# Patient Record
Sex: Male | Born: 1950 | Race: White | Hispanic: No | Smoking: Former smoker
Health system: Southern US, Community
[De-identification: ages and names within clinical notes are randomized; demographics above are authoritative.]

## PROBLEM LIST (undated history)

## (undated) DIAGNOSIS — G4730 Sleep apnea, unspecified: Secondary | ICD-10-CM

## (undated) DIAGNOSIS — E7800 Pure hypercholesterolemia, unspecified: Secondary | ICD-10-CM

## (undated) DIAGNOSIS — N200 Calculus of kidney: Secondary | ICD-10-CM

## (undated) DIAGNOSIS — C801 Malignant (primary) neoplasm, unspecified: Secondary | ICD-10-CM

## (undated) DIAGNOSIS — Z87442 Personal history of urinary calculi: Secondary | ICD-10-CM

## (undated) DIAGNOSIS — K4090 Unilateral inguinal hernia, without obstruction or gangrene, not specified as recurrent: Secondary | ICD-10-CM

## (undated) HISTORY — PX: INGUINAL HERNIA REPAIR: SUR1180

## (undated) HISTORY — PX: TOE SURGERY: SHX1073

---
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
## 2003-10-29 ENCOUNTER — Ambulatory Visit (HOSPITAL_COMMUNITY): Admission: RE | Admit: 2003-10-29 | Discharge: 2003-10-29 | Payer: Self-pay | Admitting: General Surgery

## 2012-08-09 ENCOUNTER — Emergency Department (HOSPITAL_COMMUNITY): Payer: 59

## 2012-08-09 ENCOUNTER — Emergency Department (HOSPITAL_COMMUNITY)
Admission: EM | Admit: 2012-08-09 | Discharge: 2012-08-09 | Disposition: A | Discharge status: Home / Self Care | Payer: 59 | Attending: Emergency Medicine | Admitting: Emergency Medicine

## 2012-08-09 ENCOUNTER — Encounter (HOSPITAL_COMMUNITY): Payer: Self-pay | Admitting: Emergency Medicine

## 2012-08-09 DIAGNOSIS — Z87442 Personal history of urinary calculi: Secondary | ICD-10-CM | POA: Insufficient documentation

## 2012-08-09 DIAGNOSIS — M79604 Pain in right leg: Secondary | ICD-10-CM

## 2012-08-09 DIAGNOSIS — E7800 Pure hypercholesterolemia, unspecified: Secondary | ICD-10-CM | POA: Insufficient documentation

## 2012-08-09 DIAGNOSIS — R52 Pain, unspecified: Secondary | ICD-10-CM

## 2012-08-09 DIAGNOSIS — M7989 Other specified soft tissue disorders: Secondary | ICD-10-CM | POA: Insufficient documentation

## 2012-08-09 DIAGNOSIS — M79609 Pain in unspecified limb: Secondary | ICD-10-CM | POA: Insufficient documentation

## 2012-08-09 DIAGNOSIS — Z8719 Personal history of other diseases of the digestive system: Secondary | ICD-10-CM | POA: Insufficient documentation

## 2012-08-09 DIAGNOSIS — Z87891 Personal history of nicotine dependence: Secondary | ICD-10-CM | POA: Insufficient documentation

## 2012-08-09 HISTORY — DX: Pure hypercholesterolemia, unspecified: E78.00

## 2012-08-09 HISTORY — DX: Calculus of kidney: N20.0

## 2012-08-09 HISTORY — DX: Unilateral inguinal hernia, without obstruction or gangrene, not specified as recurrent: K40.90

## 2012-08-09 MED ORDER — ENOXAPARIN SODIUM 120 MG/0.8ML ~~LOC~~ SOLN
1.0000 mg/kg | Freq: Once | SUBCUTANEOUS | Status: AC
  Administered 2012-08-09: 105 mg via SUBCUTANEOUS
  Filled 2012-08-09: qty 0.8

## 2012-08-09 NOTE — ED Notes (Signed)
Patient c/o right leg pain and swelling. Patient has positive homans side with pain in calf. Patient has multiple veracious veins, denies hx of DVT. Pedal pulse present, foot warm.

## 2012-08-09 NOTE — ED Provider Notes (Addendum)
History    This chart was scribed for joe Huriel Matt  MD by Gerlean Ren, ED Scribe. This patient was seen in room APA16A/APA16A and the patient's care was started at 6:40 PM    CSN: [id number]  Arrival date & time 08/09/12  3086   First MD Initiated Contact with Patient 08/09/12 1840      Chief Complaint  Patient presents with  . Leg Swelling  . Leg Pain     The history is provided by the patient. No language interpreter was used.  Jonathan Chase is a 62 y.o. male who presents to the Emergency Department complaining of right lower leg swelling with associated pain first noticed 10 days ago that has been constant since and neither worsening nor improving.  Pain temporarily worsens when pt stands up but will gradually reduce to baseline pain after ambulating for short period of time.  Pt has used ibuprofen with no improvements to pain.  Pt reports a long car trip 2 days after first noticing pain and swelling and denies that they have worsened since this trip.  Pt denies any recent surgeries.  No h/o blood clots.    Past Medical History  Diagnosis Date  . High cholesterol   . Kidney stones   . Inguinal hernia     Past Surgical History  Procedure Laterality Date  . Inguinal hernia repair Right   . Femur surgery  1967  . Toe surgery Right     second toe    Family History  Problem Relation Age of Onset  . Aortic aneurysm Father     History  Substance Use Topics  . Smoking status: Former Smoker    Types: Cigars  . Smokeless tobacco: Never Used  . Alcohol Use: No      Review of Systems  Constitutional: Negative for appetite change and fatigue.  HENT: Negative for congestion, sinus pressure and ear discharge.   Eyes: Negative for discharge.  Respiratory: Negative for cough.   Cardiovascular: Positive for leg swelling (with associated pain). Negative for chest pain.  Gastrointestinal: Negative for abdominal pain and diarrhea.  Genitourinary: Negative for frequency and  hematuria.  Musculoskeletal: Negative for back pain.  Skin: Negative for rash.  Neurological: Negative for seizures and headaches.  Psychiatric/Behavioral: Negative for hallucinations.    Allergies  Lortab and Robaxin  Home Medications  No current outpatient prescriptions on file.  BP 122/84  Pulse 74  Temp(Src) 97.8 F (36.6 C) (Oral)  Resp 17  Ht 5\' 9"  (1.753 m)  Wt 231 lb (104.781 kg)  BMI 34.1 kg/m2  SpO2 95%  Physical Exam  Nursing note and vitals reviewed. Constitutional: He is oriented to person, place, and time. He appears well-developed.  HENT:  Head: Normocephalic.  Eyes: Conjunctivae and EOM are normal. No scleral icterus.  Neck: Neck supple. No thyromegaly present.  Cardiovascular: Normal rate and regular rhythm.  Exam reveals no gallop and no friction rub.   No murmur heard. Pulmonary/Chest: No stridor. He has no wheezes. He has no rales. He exhibits no tenderness.  Abdominal: He exhibits no distension. There is no tenderness. There is no rebound.  Musculoskeletal: Normal range of motion. He exhibits no edema.  Lymphadenopathy:    He has no cervical adenopathy.  Neurological: He is oriented to person, place, and time. Coordination normal.  Skin: No rash noted. No erythema.  Psychiatric: He has a normal mood and affect. His behavior is normal.    ED Course  Procedures (including critical  care time) DIAGNOSTIC STUDIES: Oxygen Saturation is 95% on room air, adequate by my interpretation.    COORDINATION OF CARE: 6:44 PM- Patient informed of clinical course, understands medical decision-making process, and agrees with plan.  Labs Reviewed - No data to display No results found.   No diagnosis found.    MDM  Pt to get lovenox and follow up for [place] in am The chart was scribed for me under my direct supervision.  I personally performed the history, physical, and medical decision making and all procedures in the evaluation of this  patient.Benny Lennert, MD 08/09/12 1945  Benny Lennert, MD 08/09/12 2026

## 2012-08-10 ENCOUNTER — Ambulatory Visit (HOSPITAL_COMMUNITY)
Admit: 2012-08-10 | Discharge: 2012-08-10 | Disposition: A | Discharge status: Home / Self Care | Payer: 59 | Source: Ambulatory Visit | Attending: Emergency Medicine | Admitting: Emergency Medicine

## 2012-08-10 ENCOUNTER — Telehealth: Payer: Self-pay | Admitting: Orthopedic Surgery

## 2012-08-10 DIAGNOSIS — M79609 Pain in unspecified limb: Secondary | ICD-10-CM | POA: Insufficient documentation

## 2012-08-10 DIAGNOSIS — M7989 Other specified soft tissue disorders: Secondary | ICD-10-CM | POA: Insufficient documentation

## 2012-08-10 DIAGNOSIS — R52 Pain, unspecified: Secondary | ICD-10-CM

## 2012-08-10 NOTE — ED Provider Notes (Signed)
Patient returns as scheduled for venous evaluation. Procedure has been performed and there is no evidence of DVT. Patient was counseled that he needs to followup with his primary doctor. Examination reveals no significant swelling, he does not have any overlying skin changes.  Gilda Crease, MD 08/10/12 [phone]

## 2012-08-10 NOTE — Telephone Encounter (Signed)
Patient came to office following Jonathan Chase Emergency room visit today, 08/10/12, states had Xray, and CT for right leg pain, swelling.  He relays that he had surgery in 1996 and has a "metal plate" in same leg, and had no other treatment after surgery and post op care, as said had been fine for a good while.  He states he had been seen by an orthopedic surgeon, [employer], in 2005, and states his leg had been placed in a cast for 6 weeks.  He will request previous medical records and films/reports.   Please advise if need to review records prior to scheduling the emergency room follow up appointment.  Patient's cell # [phone], home # [phone]

## 2012-08-11 NOTE — Telephone Encounter (Signed)
Called back to patient - advised per Dr. Mort Sawyers note.  He will bring the medical records for review and further advice per Dr. Romeo Apple.

## 2012-08-11 NOTE — Telephone Encounter (Signed)
Yes  And please do not scehedule until i have reviewed everthing

## 2012-08-24 ENCOUNTER — Ambulatory Visit (INDEPENDENT_AMBULATORY_CARE_PROVIDER_SITE_OTHER): Payer: 59 | Admitting: Orthopedic Surgery

## 2012-08-24 ENCOUNTER — Encounter: Payer: Self-pay | Admitting: Orthopedic Surgery

## 2012-08-24 VITALS — Ht 69.0 in | Wt 230.0 lb

## 2012-08-24 DIAGNOSIS — M79669 Pain in unspecified lower leg: Secondary | ICD-10-CM | POA: Insufficient documentation

## 2012-08-24 DIAGNOSIS — M79609 Pain in unspecified limb: Secondary | ICD-10-CM

## 2012-08-24 DIAGNOSIS — M79661 Pain in right lower leg: Secondary | ICD-10-CM

## 2012-08-24 NOTE — Patient Instructions (Signed)
Activities as tolerated  Return as needed

## 2012-08-24 NOTE — Progress Notes (Signed)
Patient ID: Jonathan Chase, male   DOB: Mar 13, 1951, 62 y.o.   MRN: [id number] Chief Complaint  Patient presents with  . Knee Pain    Right knee pain. No injury. Xray at [facility].    Patient history this patient has an unusual history of right calf pain for which she went to the emergency room on May 13 I reviewed the notes. Basically he was preparing for a trip on Monday woke up the next day with severe calf pain difficulty ambulation went to the emergency room and had an ultrasound eventually which showed no evidence of DVT  Since that time his pain has gotten much better. He does have a little discomfort a high and his knee and behind his lower thigh and at the proximal portion of his calf  He denies any numbness or tingling he did have some swelling it worsened after he walks for long periods of time but he says is definitely getting better.  He would also like to establish contact with an orthopedist in [place]  He is allergic to Robaxin and Lortab he has severe reaction to these drugs when taken at the same time  He has a history of a fractured femur with an old K. nail back in October 1967. He had an inguinal hernia repair diameter repair  His other history is in her medical record he is followed by [facility]  5 recorded his meds his social history is that he is married doesn't smoke or drink he doesn't use any street drugs  Review of systems he reports some snoring flank pain joint swelling and joint pain related to his calf injury muscle pain  He has some seasonal allergies to grass  He lost 43 pounds when he was told he might have early diabetes  Ht 5\' 9"  (1.753 m)  Wt 230 lb (104.327 kg)  BMI 33.95 kg/m2 His exam is essentially normal he has normal development nutrition body habitus is a mesomorphic has no deformities he is well-groomed has normal pulses in both feet normal temperature no edema no swelling no major varicose veins. He  ambulates normally. Both calves are examined with both lower extremities the only finding is that his right calf is 45 cm circumference left 43 has some tenderness in both calves normal range of motion in his knees both knees are stable muscle strength and muscle tone are normal skin is intact normal sensation is oriented x3 his mood and affect are normal  His ultrasound and tib-fib x-ray show no abnormality  Impression Strain  Recommend activities as tolerated follow up as needed

## 2012-09-07 ENCOUNTER — Ambulatory Visit: Payer: 59 | Admitting: Orthopedic Surgery

## 2013-02-02 ENCOUNTER — Other Ambulatory Visit: Payer: Self-pay

## 2014-11-06 ENCOUNTER — Encounter (HOSPITAL_COMMUNITY): Payer: Self-pay | Admitting: *Deleted

## 2014-11-06 ENCOUNTER — Emergency Department (HOSPITAL_COMMUNITY)
Admission: EM | Admit: 2014-11-06 | Discharge: 2014-11-06 | Disposition: A | Discharge status: Home / Self Care | Payer: Medicare Other | Attending: Emergency Medicine | Admitting: Emergency Medicine

## 2014-11-06 DIAGNOSIS — Y998 Other external cause status: Secondary | ICD-10-CM | POA: Insufficient documentation

## 2014-11-06 DIAGNOSIS — Z79899 Other long term (current) drug therapy: Secondary | ICD-10-CM | POA: Insufficient documentation

## 2014-11-06 DIAGNOSIS — Y9289 Other specified places as the place of occurrence of the external cause: Secondary | ICD-10-CM | POA: Diagnosis not present

## 2014-11-06 DIAGNOSIS — S81811A Laceration without foreign body, right lower leg, initial encounter: Secondary | ICD-10-CM | POA: Insufficient documentation

## 2014-11-06 DIAGNOSIS — E780 Pure hypercholesterolemia: Secondary | ICD-10-CM | POA: Diagnosis not present

## 2014-11-06 DIAGNOSIS — Z23 Encounter for immunization: Secondary | ICD-10-CM | POA: Insufficient documentation

## 2014-11-06 DIAGNOSIS — Z7952 Long term (current) use of systemic steroids: Secondary | ICD-10-CM | POA: Insufficient documentation

## 2014-11-06 DIAGNOSIS — Y9389 Activity, other specified: Secondary | ICD-10-CM | POA: Insufficient documentation

## 2014-11-06 DIAGNOSIS — Z87891 Personal history of nicotine dependence: Secondary | ICD-10-CM | POA: Insufficient documentation

## 2014-11-06 DIAGNOSIS — W228XXA Striking against or struck by other objects, initial encounter: Secondary | ICD-10-CM | POA: Diagnosis not present

## 2014-11-06 DIAGNOSIS — Z87442 Personal history of urinary calculi: Secondary | ICD-10-CM | POA: Diagnosis not present

## 2014-11-06 DIAGNOSIS — Z8719 Personal history of other diseases of the digestive system: Secondary | ICD-10-CM | POA: Insufficient documentation

## 2014-11-06 DIAGNOSIS — S8991XA Unspecified injury of right lower leg, initial encounter: Secondary | ICD-10-CM | POA: Diagnosis present

## 2014-11-06 MED ORDER — TETANUS-DIPHTH-ACELL PERTUSSIS 5-2.5-18.5 LF-MCG/0.5 IM SUSP
INTRAMUSCULAR | Status: AC
Start: 2014-11-06 — End: 2014-11-06
  Administered 2014-11-06: 0.5 mL via INTRAMUSCULAR
  Filled 2014-11-06: qty 0.5

## 2014-11-06 MED ORDER — TETANUS-DIPHTH-ACELL PERTUSSIS 5-2.5-18.5 LF-MCG/0.5 IM SUSP: 0.5000 mL | Freq: Once | INTRAMUSCULAR | Status: AC

## 2014-11-06 NOTE — ED Provider Notes (Signed)
CSN: [id number]     Arrival date & time 11/06/14  1622 History   First MD Initiated Contact with Patient 11/06/14 1624     Chief Complaint  Patient presents with  . Leg Injury     (Consider location/radiation/quality/duration/timing/severity/associated sxs/prior Treatment) HPI Comments: Pt comes in with c/o laceration that won't stop bleeding. He states that he was weed eating and rock hit him in his right lower leg. He states that he has put multiple dressings to the area and has not been able to stop them from bleeding. Doesn't think there are any retained products. Unsure of last tetanus  The history is provided by the patient. No language interpreter was used.    Past Medical History  Diagnosis Date  . High cholesterol   . Inguinal hernia   . Kidney stones    Past Surgical History  Procedure Laterality Date  . Inguinal hernia repair Right   . Femur surgery  1967  . Toe surgery Right     second toe   Family History  Problem Relation Age of Onset  . Aortic aneurysm Father    History  Substance Use Topics  . Smoking status: Former Smoker    Types: Cigars  . Smokeless tobacco: Never Used  . Alcohol Use: No    Review of Systems  All other systems reviewed and are negative.     Allergies  Lortab and Robaxin  Home Medications   Prior to Admission medications   Medication Sig Start Date End Date Taking? Authorizing Provider  aspirin EC 81 MG tablet Take 81 mg by mouth every evening.    Historical Provider, MD  atorvastatin (LIPITOR) 20 MG tablet Take 20 mg by mouth every evening.    Historical Provider, MD  ibuprofen (ADVIL,MOTRIN) 200 MG tablet Take 200 mg by mouth every 6 (six) hours as needed for pain.    Historical Provider, MD  Multiple Vitamins-Minerals (CENTRUM SILVER ADULT 50+ PO) Take 1 tablet by mouth every morning.    Historical Provider, MD  Omega-3 Fatty Acids (FISH OIL TRIPLE STRENGTH) 1400 MG CAPS Take 1 capsule by mouth every morning.    Historical  Provider, MD  psyllium (HYDROCIL/METAMUCIL) 95 % PACK Take 1 packet by mouth daily.    Historical Provider, MD   BP 138/97 mmHg  Pulse 61  Temp(Src) 98.4 F (36.9 C) (Oral)  Resp 18  Ht 5\' 9"  (1.753 m)  Wt 215 lb (97.523 kg)  BMI 31.74 kg/m2  SpO2 98% Physical Exam  Constitutional: He is oriented to person, place, and time. He appears well-developed and well-nourished.  Cardiovascular: Normal rate and regular rhythm.   Pulmonary/Chest: Effort normal and breath sounds normal.  Musculoskeletal: Normal range of motion.  Neurological: He is alert and oriented to person, place, and time.  Skin:  .5cm laceration noted the right lower leg. Pulses intact  Nursing note and vitals reviewed.   ED Course  LACERATION REPAIR Date/Time: 11/06/2014 4:48 PM Performed by: Glendell Docker Authorized by: Glendell Docker Consent: Verbal consent obtained. Consent given by: patient Patient identity confirmed: verbally with patient Body area: lower extremity Location details: right lower leg Preparation: Patient was prepped and draped in the usual sterile fashion. Irrigation solution: saline Amount of cleaning: standard Debridement: none Degree of undermining: none Skin closure: 4-0 Prolene Number of sutures: 2 Technique: simple Approximation: close Approximation difficulty: simple Patient tolerance: Patient tolerated the procedure well with no immediate complications   (including critical care time) Labs Review Labs Reviewed -  No data to display  Imaging Review No results found.   EKG Interpretation None      MDM   Final diagnoses:  Leg laceration, right, initial encounter    Tetanus updated. Pt given wound care instructions. To have the sutures removed in 7-10 days    Glendell Docker, NP 11/06/14 [place], MD 11/06/14 1911

## 2014-11-06 NOTE — Discharge Instructions (Signed)

## 2014-11-06 NOTE — ED Notes (Signed)
Patient reports using a weed eater and rock hit his right leg, patient reports it is a small laceration but will not stop bleeding. Leg is wrapped with coban in triage.

## 2021-08-07 ENCOUNTER — Encounter: Payer: Self-pay | Admitting: Urology

## 2021-08-07 ENCOUNTER — Ambulatory Visit (INDEPENDENT_AMBULATORY_CARE_PROVIDER_SITE_OTHER): Payer: Medicare Other | Admitting: Urology

## 2021-08-07 VITALS — BP 153/81 | HR 64 | Ht 69.0 in | Wt 250.0 lb

## 2021-08-07 DIAGNOSIS — R3915 Urgency of urination: Secondary | ICD-10-CM

## 2021-08-07 DIAGNOSIS — R972 Elevated prostate specific antigen [PSA]: Secondary | ICD-10-CM

## 2021-08-07 DIAGNOSIS — R310 Gross hematuria: Secondary | ICD-10-CM | POA: Diagnosis not present

## 2021-08-07 DIAGNOSIS — Z87442 Personal history of urinary calculi: Secondary | ICD-10-CM

## 2021-08-07 DIAGNOSIS — N403 Nodular prostate with lower urinary tract symptoms: Secondary | ICD-10-CM | POA: Diagnosis not present

## 2021-08-07 LAB — MICROSCOPIC EXAMINATION
Bacteria, UA: NONE SEEN
Epithelial Cells (non renal): NONE SEEN /hpf (ref 0–10)
Renal Epithel, UA: NONE SEEN /hpf
WBC, UA: NONE SEEN /hpf (ref 0–5)

## 2021-08-07 LAB — URINALYSIS, ROUTINE W REFLEX MICROSCOPIC
Bilirubin, UA: NEGATIVE
Glucose, UA: NEGATIVE
Ketones, UA: NEGATIVE
Leukocytes,UA: NEGATIVE
Nitrite, UA: NEGATIVE
Specific Gravity, UA: 1.025 (ref 1.005–1.030)
Urobilinogen, Ur: 0.2 mg/dL (ref 0.2–1.0)
pH, UA: 7 (ref 5.0–7.5)

## 2021-08-07 NOTE — Progress Notes (Signed)
?Subjective: ?1. Gross hematuria   ?2. History of nephrolithiasis   ?3. Elevated PSA   ?4. 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 Urgency of urination   ?  ? ?Consult requested by Dr. Earney Mallet.  ? ? ?Jonathan Chase is a 71 yo male who had hematuria with right flank pain on 01/22/21 but didn't pass the stone.   He had another episode on 07/24/21 of right flank pain and hematuria and again didn't pass a stone.  He has had some mild left flank pain since then.  He has persistent mild nausea.  He has some urgency but minimal frequency and no nocturia.  His IPSS is 4.  He passed stones in 7/96 and again in 2004-5.  He has had prior prostatitis but none in a long time.  His PSA was 5.4 on 05/26/21 and it was 3.8 in 2022 and 2.2 in 2021.    ?ROS: ? ?Review of Systems  ?HENT:  Positive for congestion.   ?Musculoskeletal:  Positive for back pain and joint pain.  ? ?Allergies  ?Allergen Reactions  ? Lortab [Hydrocodone-Acetaminophen] Anaphylaxis  ? Robaxin [Methocarbamol] Anaphylaxis  ? ? ?Past Medical History:  ?Diagnosis Date  ? High cholesterol   ? Inguinal hernia   ? Kidney stones   ? ? ?Past Surgical History:  ?Procedure Laterality Date  ? [place]  ? INGUINAL HERNIA REPAIR Right   ? TOE SURGERY Right   ? second toe  ? ? ?Social History  ? ?Socioeconomic History  ? Marital status: Married  ?  Spouse name: Not on file  ? Number of children: Not on file  ? Years of education: Not on file  ? Highest education level: Not on file  ?Occupational History  ? Not on file  ?Tobacco Use  ? Smoking status: Former  ?  Types: Cigars  ? Smokeless tobacco: Never  ?Substance and Sexual Activity  ? Alcohol use: No  ? Drug use: No  ? Sexual activity: Not on file  ?Other Topics Concern  ? Not on file  ?Social History Narrative  ? Not on file  ? ?Social Determinants of Health  ? ?Financial Resource Strain: Not on file  ?Food Insecurity: Not on file  ?Transportation Needs: Not on file  ?Physical Activity: Not on  file  ?Stress: Not on file  ?Social Connections: Not on file  ?Intimate Partner Violence: Not on file  ? ? ?Family History  ?Problem Relation Age of Onset  ? Aortic aneurysm Father   ? ? ?Anti-infectives: ?Anti-infectives (From admission, onward)  ? ? None  ? ?  ? ? ?Current Outpatient Medications  ?Medication Sig Dispense Refill  ? acetaminophen (TYLENOL) 500 MG tablet     ? ibuprofen (ADVIL,MOTRIN) 200 MG tablet Take 200 mg by mouth every 6 (six) hours as needed for pain.    ? Multiple Vitamins-Minerals (CENTRUM SILVER ADULT 50+ PO) Take 1 tablet by mouth every morning.    ? Omega-3 Fatty Acids (FISH OIL TRIPLE STRENGTH) 1400 MG CAPS Take 1 capsule by mouth every morning.    ? psyllium (HYDROCIL/METAMUCIL) 95 % PACK Take 1 packet by mouth daily.    ? ?No current facility-administered medications for this visit.  ? ? ? ?Objective: ?Vital signs in last 24 hours: ?BP (!) 153/81   Pulse 64   Ht '5\' 9"'$  (1.753 m)   Wt 250 lb (113.4 kg)   BMI 36.92 kg/m?  ? ?Intake/Output from previous day: ?No  intake/output data recorded. ?Intake/Output this shift: ?'@IOTHISSHIFT'$ @ ? ? ?Physical Exam ?Vitals reviewed.  ?Constitutional:   ?   Appearance: Normal appearance. He is obese.  ?Cardiovascular:  ?   Rate and Rhythm: Normal rate and regular rhythm.  ?   Pulses: Normal pulses.  ?   Heart sounds: Normal heart sounds.  ?Pulmonary:  ?   Effort: Pulmonary effort is normal. No respiratory distress.  ?   Breath sounds: Normal breath sounds.  ?Abdominal:  ?   Palpations: Abdomen is soft. There is no mass.  ?   Tenderness: There is no abdominal tenderness.  ?Genitourinary: ?   Comments: Normal phallus with adequate meatus. ?Scrotum, testes and epididymis normal. ?AP without lesions. ?NST with mild stenosis.  No mass. ?Prostate 1.5 with right base induration.   ?SV non-palpable.  ?Musculoskeletal:     ?   General: No swelling or tenderness. Normal range of motion.  ?   Cervical back: Normal range of motion and neck supple.   ?Lymphadenopathy:  ?   Cervical: No cervical adenopathy.  ?   Upper Body:  ?   Right upper body: No supraclavicular or axillary adenopathy.  ?   Left upper body: No supraclavicular or axillary adenopathy.  ?   Lower Body: No right inguinal adenopathy. No left inguinal adenopathy.  ?Skin: ?   General: Skin is warm and dry.  ?Neurological:  ?   General: No focal deficit present.  ?   Mental Status: He is alert and oriented to person, place, and time.  ?Psychiatric:     ?   Mood and Affect: Mood normal.     ?   Behavior: Behavior normal.  ? ? ?Lab Results:  ?Results for orders placed or performed in visit on 08/07/21 (from the past 24 hour(s))  ?Urinalysis, Routine w reflex microscopic     Status: Abnormal  ? Collection Time: 08/07/21  9:11 AM  ?Result Value Ref Range  ? Specific Gravity, UA 1.025 1.005 - 1.030  ? pH, UA 7.0 5.0 - 7.5  ? Color, UA Yellow Yellow  ? Appearance Ur Clear Clear  ? Leukocytes,UA Negative Negative  ? Protein,UA 2+ (A) Negative/Trace  ? Glucose, UA Negative Negative  ? Ketones, UA Negative Negative  ? RBC, UA Trace (A) Negative  ? Bilirubin, UA Negative Negative  ? Urobilinogen, Ur 0.2 0.2 - 1.0 mg/dL  ? Nitrite, UA Negative Negative  ? Microscopic Examination See below:   ? Narrative  ? Performed at:  [facility] ?[address] [place], [place]  [id number] ?Lab Director: [place], Phone:  [phone]  ?Microscopic Examination     Status: None  ? Collection Time: 08/07/21  9:11 AM  ? Urine  ?Result Value Ref Range  ? WBC, UA None seen 0 - 5 /hpf  ? RBC 0-2 0 - 2 /hpf  ? Epithelial Cells (non renal) None seen 0 - 10 /hpf  ? Renal Epithel, UA None seen None seen /hpf  ? Mucus, UA Present Not Estab.  ? Bacteria, UA None seen None seen/Few  ? Narrative  ? Performed at:  [facility] ?[address], [place], [place]  [id number] ?Lab Director: [place], Phone:  [phone]  ?  ?BMET ?No results for input(s): NA, K, CL, CO2, GLUCOSE, BUN, CREATININE,  CALCIUM in the last 72 hours. ?PT/INR ?No results for input(s): LABPROT, INR in the last 72 hours. ?ABG ?No results for input(s): PHART, HCO3 in the last  72 hours. ? ?Invalid input(s): PCO2, PO2 ?UA has 0-2 RBC's. ?PSA  2021 2.2 ?          2022 3.8 ?          2023 5.4 ?Cr 0.96 on 05/26/20. ?CBC, CMP, Lipid and Hgb A1c reviewed.  ?Studies/Results: ?No results found. ? ? ?Assessment/Plan: ?Gross hematuria with flank pain and a history of  stones.   I am going to get a CT hematuria study for thoroughness and will call with the results and arrange f/u accordingly.  He brought a stone that I will send for analysis. ? ?2.    Elevated PSA with right base prostate nodule.   He is going to need a prostate [place] and biopsy    and I reviewed the risks of bleeding, infection and difficulty voiding.   I will defer scheduling until we have the results of the CT scan in case he needs an operative procedure.   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  ? ?3.     Urinary urgency.   He could have a distal stone or some other bladder pathology as a cause.  ? ?No orders of the defined types were placed in this encounter. ?  ? ?Orders Placed This Encounter  ?Procedures  ? Microscopic Examination  ? CT HEMATURIA WORKUP  ?  Standing Status:   Future  ?  Standing Expiration Date:   02/07/2022  ?  Order Specific Question:   Reason for Exam (SYMPTOM  OR DIAGNOSIS REQUIRED)  ?  Answer:   gross hematuria with history of stones.  ?  Order Specific Question:   Preferred imaging location?  ?  Answer:   [facility]  ?  Order Specific Question:   Radiology Contrast Protocol - do NOT remove file path  ?  Answer:   [email or web address]  ? Urinalysis, Routine w reflex microscopic  ? Calculi, with Photograph (to Clinical Lab)  ?  ? ?Return for I will arrange f/u once we have the CT results and can determine the need for any surgery. .  ? ? ?CC: Dr. Earney Mallet  ? ? ? ? ?Irine Seal ?08/08/2021 ?[phone] ?

## 2021-08-08 ENCOUNTER — Ambulatory Visit (HOSPITAL_COMMUNITY)
Admission: RE | Admit: 2021-08-08 | Discharge: 2021-08-08 | Disposition: A | Discharge status: Home / Self Care | Payer: Medicare Other | Source: Ambulatory Visit | Attending: Urology | Admitting: Urology

## 2021-08-08 DIAGNOSIS — R310 Gross hematuria: Secondary | ICD-10-CM | POA: Insufficient documentation

## 2021-08-08 LAB — POCT I-STAT CREATININE: Creatinine, Ser: 1.1 mg/dL (ref 0.61–1.24)

## 2021-08-08 MED ORDER — IOHEXOL 300 MG/ML  SOLN
100.0000 mL | Freq: Once | INTRAMUSCULAR | Status: AC | PRN
  Administered 2021-08-08: 100 mL via INTRAVENOUS

## 2021-08-11 ENCOUNTER — Telehealth: Payer: Self-pay

## 2021-08-11 NOTE — Telephone Encounter (Signed)
FYI patient wanted to let you know that he is bringing in hospice for his wife so any surgery or procedure he will need will have to work around the caregiver schedule.  He is trying to be prepared so there will be someone to care for her while he is gone. ?

## 2021-08-11 NOTE — Progress Notes (Signed)
Sent to PCP via fax ?

## 2021-08-15 LAB — CALCULI, WITH PHOTOGRAPH (CLINICAL LAB)
Calcium Oxalate Monohydrate: 100 %
Weight Calculi: 28 mg

## 2021-08-18 ENCOUNTER — Telehealth: Payer: Self-pay

## 2021-08-18 NOTE — Telephone Encounter (Signed)
Patient left a voice message 08-15-2021:  Wanting to get scheduled for surgery.  Please advise.  Call back:   [phone]  Thanks, Helene Kelp

## 2021-08-19 NOTE — Telephone Encounter (Signed)
Patient left a voice mail about surgery. Message sent to provider

## 2021-09-04 ENCOUNTER — Other Ambulatory Visit: Payer: Self-pay

## 2021-09-04 ENCOUNTER — Telehealth: Payer: Self-pay

## 2021-09-04 DIAGNOSIS — R972 Elevated prostate specific antigen [PSA]: Secondary | ICD-10-CM

## 2021-09-04 NOTE — Telephone Encounter (Signed)
I spoke with Jonathan Chase. We have discussed possible surgery dates and 09/17/2021 was agreed upon by all parties. Patient given information about surgery date, what to expect pre-operatively and post operatively.    We discussed that a pre-op nurse will be calling to set up the pre-op visit that will take place prior to surgery. Informed patient that our office will communicate any additional care to be provided after surgery.    Patients questions or concerns were discussed during our call. Advised to call our office should there be any additional information, questions or concerns that arise. Patient verbalized understanding.

## 2021-09-10 ENCOUNTER — Other Ambulatory Visit: Payer: Self-pay | Admitting: Urology

## 2021-09-10 DIAGNOSIS — R972 Elevated prostate specific antigen [PSA]: Secondary | ICD-10-CM

## 2021-09-12 NOTE — Patient Instructions (Signed)
Jonathan Chase  09/12/2021     '@PREFPERIOPPHARMACY'$ @   Your procedure is scheduled on  09/17/2021.   Report to [facility] at  0900  A.M.   Call this number if you have problems the morning of surgery:  [phone]   Remember:  Do not eat or drink after midnight.      Take these medicines the morning of surgery with A SIP OF WATER                                       None.     Do not wear jewelry, make-up or nail polish.  Do not wear lotions, powders, or perfumes, or deodorant.  Do not shave 48 hours prior to surgery.  Men may shave face and neck.  Do not bring valuables to the hospital.  [facility] is not responsible for any belongings or valuables.  Contacts, dentures or bridgework may not be worn into surgery.  Leave your suitcase in the car.  After surgery it may be brought to your room.  For patients admitted to the hospital, discharge time will be determined by your treatment team.  Patients discharged the day of surgery will not be allowed to drive home and must have someone with them for 24 hours.    Special instructions:   DO NOT smoke tobacco or vape for 24 hours before your procedure.  Please read over the following fact sheets that you were given. Coughing and Deep Breathing, Surgical Site Infection Prevention, Anesthesia Post-op Instructions, and Care and Recovery After Surgery      Transrectal Ultrasound-Guided Prostate Biopsy, Care After What can I expect after the procedure? After the procedure, it is common to have: Pain and discomfort near your butt (rectum), especially while sitting. Pink-colored pee (urine). This is due to small amounts of blood in your pee. A burning feeling while peeing. Blood in your poop (stool). Bleeding from your butt. Blood in your semen. Follow these instructions at home: Medicines Take over-the-counter and prescription medicines only as told by your doctor. If you were given a sedative during your  procedure, do not drive or use machines until your doctor says that it is safe. A sedative is a medicine that helps you relax. If you were prescribed an antibiotic medicine, take it as told by your doctor. Do not stop taking it even if you start to feel better. Activity  Return to your normal activities when your doctor says that it is safe. Ask your doctor when it is okay for you to have sex. You may have to avoid lifting. Ask your doctor how much you can safely lift. General instructions  Drink enough water to keep your pee pale yellow. Watch your pee, poop, and semen for new bleeding or bleeding that gets worse. Keep all follow-up visits. Contact a doctor if: You have any of these: Blood clots in your pee or poop. Blood in your pee more than 2 weeks after the procedure. Blood in your semen more than 2 months after the procedure. New or worse bleeding in your pee, poop, or semen. Very bad belly pain. Your pee smells bad or unusual. You have trouble peeing. Your lower belly feels firm. You have problems getting an erection. You feel like you may vomit (are nauseous), or you vomit. Get help right away if: You have a fever or  chills. You have bright red pee. You have very bad pain that does not get better with medicine. You cannot pee. Summary After this procedure, it is common to have pain and discomfort near your butt, especially while sitting. You may have blood in your pee and poop. It is common to have blood in your semen. Get help right away if you have a fever or chills. This information is not intended to replace advice given to you by your health care provider. Make sure you discuss any questions you have with your health care provider. Document Revised: 09/09/2020 Document Reviewed: 09/09/2020 Elsevier Patient Education  [place] Anesthesia, Adult, Care After This sheet gives you information about how to care for yourself after your procedure. Your  health care provider may also give you more specific instructions. If you have problems or questions, contact your health care provider. What can I expect after the procedure? After the procedure, the following side effects are common: Pain or discomfort at the IV site. Nausea. Vomiting. Sore throat. Trouble concentrating. Feeling cold or chills. Feeling weak or tired. Sleepiness and fatigue. Soreness and body aches. These side effects can affect parts of the body that were not involved in surgery. Follow these instructions at home: For the time period you were told by your health care provider:  Rest. Do not participate in activities where you could fall or become injured. Do not drive or use machinery. Do not drink alcohol. Do not take sleeping pills or medicines that cause drowsiness. Do not make important decisions or sign legal documents. Do not take care of children on your own. Eating and drinking Follow any instructions from your health care provider about eating or drinking restrictions. When you feel hungry, start by eating small amounts of foods that are soft and easy to digest (bland), such as toast. Gradually return to your regular diet. Drink enough fluid to keep your urine pale yellow. If you vomit, rehydrate by drinking water, juice, or clear broth. General instructions If you have sleep apnea, surgery and certain medicines can increase your risk for breathing problems. Follow instructions from your health care provider about wearing your sleep device: Anytime you are sleeping, including during daytime naps. While taking prescription pain medicines, sleeping medicines, or medicines that make you drowsy. Have a responsible adult stay with you for the time you are told. It is important to have someone help care for you until you are awake and alert. Return to your normal activities as told by your health care provider. Ask your health care provider what activities are  safe for you. Take over-the-counter and prescription medicines only as told by your health care provider. If you smoke, do not smoke without supervision. Keep all follow-up visits as told by your health care provider. This is important. Contact a health care provider if: You have nausea or vomiting that does not get better with medicine. You cannot eat or drink without vomiting. You have pain that does not get better with medicine. You are unable to pass urine. You develop a skin rash. You have a fever. You have redness around your IV site that gets worse. Get help right away if: You have difficulty breathing. You have chest pain. You have blood in your urine or stool, or you vomit blood. Summary After the procedure, it is common to have a sore throat or nausea. It is also common to feel tired. Have a responsible adult stay with you for the time you  are told. It is important to have someone help care for you until you are awake and alert. When you feel hungry, start by eating small amounts of foods that are soft and easy to digest (bland), such as toast. Gradually return to your regular diet. Drink enough fluid to keep your urine pale yellow. Return to your normal activities as told by your health care provider. Ask your health care provider what activities are safe for you. This information is not intended to replace advice given to you by your health care provider. Make sure you discuss any questions you have with your health care provider. Document Revised: 11/30/2019 Document Reviewed: 06/29/2019 Elsevier Patient Education  [place].

## 2021-09-15 ENCOUNTER — Encounter (HOSPITAL_COMMUNITY): Payer: Self-pay

## 2021-09-15 ENCOUNTER — Encounter (HOSPITAL_COMMUNITY)
Admission: RE | Admit: 2021-09-15 | Discharge: 2021-09-15 | Disposition: A | Discharge status: Home / Self Care | Payer: Medicare Other | Source: Ambulatory Visit | Attending: Urology | Admitting: Urology

## 2021-09-15 ENCOUNTER — Other Ambulatory Visit: Payer: Self-pay

## 2021-09-17 ENCOUNTER — Ambulatory Visit (HOSPITAL_COMMUNITY)
Admission: RE | Admit: 2021-09-17 | Discharge: 2021-09-17 | Disposition: A | Discharge status: Home / Self Care | Payer: Medicare Other | Attending: Urology | Admitting: Urology

## 2021-09-17 ENCOUNTER — Other Ambulatory Visit: Payer: Self-pay | Admitting: Urology

## 2021-09-17 ENCOUNTER — Ambulatory Visit (HOSPITAL_COMMUNITY): Payer: Medicare Other | Admitting: Anesthesiology

## 2021-09-17 ENCOUNTER — Ambulatory Visit (HOSPITAL_COMMUNITY)
Admission: RE | Admit: 2021-09-17 | Discharge: 2021-09-17 | Disposition: A | Discharge status: Home / Self Care | Payer: Medicare Other | Source: Ambulatory Visit | Attending: Urology | Admitting: Urology

## 2021-09-17 ENCOUNTER — Encounter (HOSPITAL_COMMUNITY): Payer: Self-pay | Admitting: Urology

## 2021-09-17 ENCOUNTER — Encounter (HOSPITAL_COMMUNITY)
Admission: RE | Disposition: A | Discharge status: Home / Self Care | Payer: Self-pay | Source: Home / Self Care | Attending: Urology

## 2021-09-17 ENCOUNTER — Ambulatory Visit (HOSPITAL_BASED_OUTPATIENT_CLINIC_OR_DEPARTMENT_OTHER): Payer: Medicare Other | Admitting: Anesthesiology

## 2021-09-17 DIAGNOSIS — R310 Gross hematuria: Secondary | ICD-10-CM | POA: Insufficient documentation

## 2021-09-17 DIAGNOSIS — Z87891 Personal history of nicotine dependence: Secondary | ICD-10-CM | POA: Insufficient documentation

## 2021-09-17 DIAGNOSIS — G4733 Obstructive sleep apnea (adult) (pediatric): Secondary | ICD-10-CM

## 2021-09-17 DIAGNOSIS — R972 Elevated prostate specific antigen [PSA]: Secondary | ICD-10-CM | POA: Diagnosis not present

## 2021-09-17 DIAGNOSIS — N402 Nodular prostate without lower urinary tract symptoms: Secondary | ICD-10-CM

## 2021-09-17 DIAGNOSIS — C61 Malignant neoplasm of prostate: Secondary | ICD-10-CM | POA: Diagnosis not present

## 2021-09-17 DIAGNOSIS — N200 Calculus of kidney: Secondary | ICD-10-CM | POA: Insufficient documentation

## 2021-09-17 DIAGNOSIS — Z9989 Dependence on other enabling machines and devices: Secondary | ICD-10-CM | POA: Diagnosis not present

## 2021-09-17 DIAGNOSIS — Z8614 Personal history of Methicillin resistant Staphylococcus aureus infection: Secondary | ICD-10-CM | POA: Diagnosis not present

## 2021-09-17 DIAGNOSIS — R319 Hematuria, unspecified: Secondary | ICD-10-CM

## 2021-09-17 HISTORY — PX: CYSTOSCOPY: SHX5120

## 2021-09-17 HISTORY — PX: PROSTATE BIOPSY: SHX241

## 2021-09-17 SURGERY — CYSTOSCOPY
Anesthesia: General | Site: Prostate

## 2021-09-17 MED ORDER — CHLORHEXIDINE GLUCONATE 0.12 % MT SOLN
15.0000 mL | Freq: Once | OROMUCOSAL | Status: AC
  Administered 2021-09-17: 15 mL via OROMUCOSAL

## 2021-09-17 MED ORDER — STERILE WATER FOR IRRIGATION IR SOLN
Status: DC | PRN
  Administered 2021-09-17: 500 mL

## 2021-09-17 MED ORDER — FENTANYL CITRATE (PF) 100 MCG/2ML IJ SOLN
INTRAMUSCULAR | Status: AC
  Filled 2021-09-17: qty 2

## 2021-09-17 MED ORDER — MIDAZOLAM HCL 2 MG/2ML IJ SOLN
INTRAMUSCULAR | Status: AC
  Filled 2021-09-17: qty 2

## 2021-09-17 MED ORDER — FENTANYL CITRATE (PF) 100 MCG/2ML IJ SOLN
INTRAMUSCULAR | Status: DC | PRN
  Administered 2021-09-17: 50 ug via INTRAVENOUS

## 2021-09-17 MED ORDER — SODIUM CHLORIDE 0.9 % IV SOLN
INTRAVENOUS | Status: DC
  Administered 2021-09-17: 1000 mL via INTRAVENOUS

## 2021-09-17 MED ORDER — LIDOCAINE HCL URETHRAL/MUCOSAL 2 % EX GEL
CUTANEOUS | Status: DC | PRN
  Administered 2021-09-17: 1

## 2021-09-17 MED ORDER — LACTATED RINGERS IV SOLN: INTRAVENOUS | Status: DC

## 2021-09-17 MED ORDER — LIDOCAINE HCL URETHRAL/MUCOSAL 2 % EX GEL
CUTANEOUS | Status: AC
  Filled 2021-09-17: qty 10

## 2021-09-17 MED ORDER — PROPOFOL 10 MG/ML IV BOLUS
INTRAVENOUS | Status: AC
  Filled 2021-09-17: qty 20

## 2021-09-17 MED ORDER — LIDOCAINE HCL (PF) 2 % IJ SOLN
INTRAMUSCULAR | Status: AC
  Filled 2021-09-17: qty 10

## 2021-09-17 MED ORDER — PROPOFOL 10 MG/ML IV BOLUS
INTRAVENOUS | Status: DC | PRN
  Administered 2021-09-17: 50 mg via INTRAVENOUS

## 2021-09-17 MED ORDER — LIDOCAINE HCL (PF) 2 % IJ SOLN
INTRAMUSCULAR | Status: AC
  Filled 2021-09-17: qty 5

## 2021-09-17 MED ORDER — SODIUM CHLORIDE 0.9 % IV SOLN
INTRAVENOUS | Status: AC
  Filled 2021-09-17: qty 20

## 2021-09-17 MED ORDER — GENTAMICIN SULFATE 40 MG/ML IJ SOLN
5.0000 mg/kg | INTRAVENOUS | Status: AC
  Administered 2021-09-17: 434.4 mg via INTRAVENOUS
  Filled 2021-09-17: qty 10.75

## 2021-09-17 MED ORDER — SODIUM CHLORIDE 0.9 % IV SOLN
2.0000 g | INTRAVENOUS | Status: AC
  Administered 2021-09-17: 2 g via INTRAVENOUS

## 2021-09-17 MED ORDER — LIDOCAINE HCL (PF) 2 % IJ SOLN
INTRAMUSCULAR | Status: DC | PRN
  Administered 2021-09-17: 10 mL

## 2021-09-17 MED ORDER — ORAL CARE MOUTH RINSE: 15.0000 mL | Freq: Once | OROMUCOSAL | Status: AC

## 2021-09-17 MED ORDER — PROPOFOL 500 MG/50ML IV EMUL
INTRAVENOUS | Status: DC | PRN
  Administered 2021-09-17: 125 ug/kg/min via INTRAVENOUS

## 2021-09-17 MED ORDER — MIDAZOLAM HCL 5 MG/5ML IJ SOLN
INTRAMUSCULAR | Status: DC | PRN
  Administered 2021-09-17: 1 mg via INTRAVENOUS

## 2021-09-17 SURGICAL SUPPLY — 19 items
BAG DRAIN URO TABLE W/ADPT NS (BAG) ×3 IMPLANT
BAG DRN 8 ADPR NS SKTRN CSTL (BAG) ×2
BAG HAMPER (MISCELLANEOUS) ×3 IMPLANT
CLOTH BEACON ORANGE TIMEOUT ST (SAFETY) ×3 IMPLANT
GAUZE SPONGE 4X4 12PLY STRL (GAUZE/BANDAGES/DRESSINGS) ×3 IMPLANT
GLOVE BIO SURGEON STRL SZ8 (GLOVE) ×3 IMPLANT
GLOVE BIOGEL PI IND STRL 7.0 (GLOVE) ×4 IMPLANT
GLOVE BIOGEL PI INDICATOR 7.0 (GLOVE) ×2
GOWN STRL REUS W/TWL LRG LVL3 (GOWN DISPOSABLE) ×6 IMPLANT
GOWN STRL REUS W/TWL XL LVL3 (GOWN DISPOSABLE) ×3 IMPLANT
KIT TURNOVER CYSTO (KITS) ×3 IMPLANT
MANIFOLD NEPTUNE II (INSTRUMENTS) ×3 IMPLANT
PACK CYSTO (CUSTOM PROCEDURE TRAY) ×3 IMPLANT
PAD ARMBOARD 7.5X6 YLW CONV (MISCELLANEOUS) ×3 IMPLANT
TOWEL NATURAL 4PK STERILE (DISPOSABLE) ×3 IMPLANT
TOWEL OR 17X24 6PK STRL BLUE (TOWEL DISPOSABLE) ×3 IMPLANT
WATER STERILE IRR 3000ML UROMA (IV SOLUTION) ×3 IMPLANT
WATER STERILE IRR 500ML POUR (IV SOLUTION) ×3 IMPLANT
YANKAUER SUCT BULB TIP 10FT TU (MISCELLANEOUS) ×1 IMPLANT

## 2021-09-17 NOTE — Discharge Instructions (Signed)
prostate

## 2021-09-17 NOTE — H&P (Signed)
Assessment: Elevated PSA Prostate nodule Gross hematuria Nephrolithiasis  Plan: Proceed with flexible cystoscopy and transrectal ultrasound and biopsy of the prostate under MAC anesthesia today. Procedure including potential risk discussed with the patient in detail.  He understands and wishes to proceed as described.  The patient presents for flexible cystoscopy, transrectal ultrasound and biopsy of prostate at [facility].  Surgical request is placed with the surgery schedulers and will be scheduled at the patient's/family request. Informed consent is given as documented below. Anesthesia: MAC  The patient does not have sleep apnea, history of MRSA, history of VRE, history of cardiac device requiring special anesthetic needs. Patient is stable and considered clear for surgical in an outpatient ambulatory surgery setting as well as patient hospital setting.  Consent for Operation or Procedure: Provider Certification I hereby certify that the nature, purpose, benefits, usual and most frequent risks of, and alternatives to, the operation or procedure have been explained to the patient (or person authorized to sign for the patient) either by me as responsible physician or by the provider who is to perform the operation or procedure. Time spent such that the patient/family has had an opportunity to ask questions, and that those questions have been answered. The patient or the patient's representative has been advised that selected tasks may be performed by assistants to the primary health care provider(s). I believe that the patient (or person authorized to sign for the patient) understands what has been explained, and has consented to the operation or procedure. No guarantees were implied or made.   Chief Complaint: elevated PSA, hematuria  History of Present Illness:  Jonathan Chase is a 71 y.o. year old male who presents for further evaluation of elevated PSA, prostate nodule, and gross  hematuria.  He was recently evaluated by Dr. Jeffie Pollock on 08/07/2021.  He was found to have a elevated PSA to 5.4 on 05/26/2021.  Prior PSA was 3.8 in 2022 and 2.2 in 2021.  Prostate examination revealed a prostate nodule on the right.  No prior prostate biopsy. He also has a history of right flank pain and gross hematuria.  He is not aware of passing a stone.  He underwent evaluation with a CT abdomen and pelvis with and without contrast on 08/08/21.  This study showed 2 small calculi in the left kidney without evidence of obstruction, no ureteral calculi, no renal masses, and no filling defects.  He has not had any further gross hematuria or flank pain.  Past Medical History:  Past Medical History:  Diagnosis Date   High cholesterol    Inguinal hernia    Kidney stones     Past Surgical History:  Past Surgical History:  Procedure Laterality Date   FEMUR SURGERY Right 03/30/1965   INGUINAL HERNIA REPAIR Right    TOE SURGERY Right    second toe    Allergies:  Allergies  Allergen Reactions   Lortab [Hydrocodone-Acetaminophen] Anaphylaxis   Robaxin [Methocarbamol] Anaphylaxis   Statins Other (See Comments)    depleted platelets count in blood    Family History:  Family History  Problem Relation Age of Onset   Aortic aneurysm Father     Social History:  Social History   Tobacco Use   Smoking status: Former    Types: Cigars    Quit date: 09/02/1988    Years since quitting: 33.0   Smokeless tobacco: Never  Vaping Use   Vaping Use: Never used  Substance Use Topics   Alcohol use: No  Drug use: No    Review of symptoms:  Constitutional:  Negative for unexplained weight loss, night sweats, fever, chills ENT:  Negative for nose bleeds, sinus pain, painful swallowing CV:  Negative for chest pain, shortness of breath, exercise intolerance, palpitations, loss of consciousness Resp:  Negative for cough, wheezing, shortness of breath GI:  Negative for nausea, vomiting, diarrhea,  bloody stools GU:  Positives noted in HPI; otherwise negative for dysuria, urinary incontinence Neuro:  Negative for seizures, poor balance, limb weakness, slurred speech Psych:  Negative for lack of energy, depression, anxiety Endocrine:  Negative for polydipsia, polyuria, symptoms of hypoglycemia (dizziness, hunger, sweating) Hematologic:  Negative for anemia, purpura, petechia, prolonged or excessive bleeding, use of anticoagulants  Allergic:  Negative for difficulty breathing or choking as a result of exposure to anything; no shellfish allergy; no allergic response (rash/itch) to materials, foods  Physical exam: BP (!) 145/91   Temp 97.6 F (36.4 C) (Oral)   Resp 14   SpO2 95%  GENERAL APPEARANCE:  Well appearing, well developed, well nourished, NAD HEENT: Atraumatic, Normocephalic, oropharynx clear. NECK: Supple without lymphadenopathy or thyromegaly. LUNGS: Clear to auscultation bilaterally. HEART: Regular Rate and Rhythm without murmurs, gallops, or rubs. ABDOMEN: Soft, non-tender, No Masses. EXTREMITIES: Moves all extremities well.  Without clubbing, cyanosis, or edema. NEUROLOGIC:  Alert and oriented x 3, normal gait, CN II-XII grossly intact.  MENTAL STATUS:  Appropriate. BACK:  Non-tender to palpation.  No CVAT SKIN:  Warm, dry and intact.    Results: None

## 2021-09-17 NOTE — Transfer of Care (Signed)
Immediate Anesthesia Transfer of Care Note  Patient: Jonathan Chase  Procedure(s) Performed: CYSTOSCOPY (Bladder) PROSTATE BIOPSY (Prostate)  Patient Location: PACU  Anesthesia Type:General  Level of Consciousness: awake  Airway & Oxygen Therapy: Patient Spontanous Breathing  Post-op Assessment: Report given to RN  Post vital signs: Reviewed and stable  Last Vitals:  Vitals Value Taken Time  BP 134/88 09/17/21 1133  Temp 36.6 C 09/17/21 1133  Pulse 51 09/17/21 1133  Resp 16 09/17/21 1133  SpO2 95 % 09/17/21 1133    Last Pain:  Vitals:   09/17/21 1133  TempSrc: Oral  PainSc:       Patients Stated Pain Goal: 8 (20/60/15 6153)  Complications: No notable events documented.

## 2021-09-17 NOTE — Interval H&P Note (Signed)
History and Physical Interval Note:  09/17/2021 9:57 AM  Jonathan Chase  has presented today for surgery, with the diagnosis of gross hematuria elevated psa with nodule.  The various methods of treatment have been discussed with the patient and family. After consideration of risks, benefits and other options for treatment, the patient has consented to  Procedure(s): CYSTOSCOPY (N/A) PROSTATE BIOPSY (N/A) as a surgical intervention.  The patient's history has been reviewed, patient examined, no change in status, stable for surgery.  I have reviewed the patient's chart and labs.  Questions were answered to the patient's satisfaction.     Michaelle Birks

## 2021-09-17 NOTE — Anesthesia Preprocedure Evaluation (Addendum)
Anesthesia Evaluation  Patient identified by MRN, date of birth, ID band Patient awake    Reviewed: Allergy & Precautions, NPO status , Patient's Chart, lab work & pertinent test results  Airway Mallampati: III  TM Distance: >3 FB Neck ROM: Full    Dental  (+) Dental Advisory Given, Teeth Intact   Pulmonary sleep apnea (sleeps in head up position) and Continuous Positive Airway Pressure Ventilation , former smoker,    Pulmonary exam normal breath sounds clear to auscultation       Cardiovascular negative cardio ROS Normal cardiovascular exam Rhythm:Regular Rate:Normal     Neuro/Psych negative neurological ROS  negative psych ROS   GI/Hepatic negative GI ROS, Neg liver ROS,   Endo/Other  negative endocrine ROS  Renal/GU Renal disease  negative genitourinary   Musculoskeletal negative musculoskeletal ROS (+)   Abdominal   Peds negative pediatric ROS (+)  Hematology negative hematology ROS (+)   Anesthesia Other Findings   Reproductive/Obstetrics negative OB ROS                            Anesthesia Physical Anesthesia Plan  ASA: 3  Anesthesia Plan: General   Post-op Pain Management:    Induction: Intravenous  PONV Risk Score and Plan: 3 and Ondansetron and Propofol infusion  Airway Management Planned: Nasal Cannula, Natural Airway and Simple Face Mask  Additional Equipment:   Intra-op Plan:   Post-operative Plan:   Informed Consent: I have reviewed the patients History and Physical, chart, labs and discussed the procedure including the risks, benefits and alternatives for the proposed anesthesia with the patient or authorized representative who has indicated his/her understanding and acceptance.     Dental advisory given  Plan Discussed with: CRNA and Surgeon  Anesthesia Plan Comments: (Possible GA with airway was discussed.)       Anesthesia Quick Evaluation

## 2021-09-17 NOTE — Op Note (Signed)
OPERATIVE NOTE   Patient Name: Jonathan Chase  MRN:  [id number]  Date of Procedure: 09/17/21   Preoperative diagnosis:  Elevated PSA Prostate nodule Gross hematuria  Postoperative diagnosis:  Elevated PSA Prostate nodule  Gross hematuria  Procedure:  Flexible cystoscopy Transrectal U/S and biopsy of prostate  Attending: Primus Bravo, MD  Anesthesia: MAC with local  Estimated blood loss: Minimal  Fluids: Per anesthesia record  Drains: None  Specimens: Prostate biopsies x 12  Antibiotics: Rocephin 2 gm IV, Gentamicin 430 mg IV  Findings: Lateral lobe enlargement of the prostate; normal bladder  Indications:  71 year old male presents for further evaluation of elevated PSA, prostate nodule, and gross hematuria.  He presents for flexible cystoscopy and transrectal ultrasound and biopsy of the prostate.  The procedure including potential risk were discussed in detail.  He understands wishes to proceed as described.  Description of Procedure:   FLEXIBLE CYSTOSCOPY  After successful induction of a MAC anesthesia, the patient was placed on the operating room table in the supine position.  The patient's genital areas prepped and draped in sterile fashion.  Under direct visualization, a flexible cystourethroscope was passed through the urethra and into the bladder.  The patient was noted to have lateral lobe enlargement of the prostate.  Inspection of the bladder showed a normal-appearing trigone with a single orifice bilaterally.  Clear efflux of urine was seen from both ureters.  No mucosal lesions were seen throughout the bladder.  TRANSRECTAL ULTRASOUND AND PROSTATE BIOPSY  The patient was placed in the left lateral decubitus position.  PROCEDURE 1.  TRANSRECTAL ULTRASOUND OF THE PROSTATE  The 7 MHz transrectal probe was used to image the prostate.  Anal stenosis was noted.  TRUS volume: 38 ml  Hypoechoic areas: None  Hyperechoic areas: None  Central  calcifications: present  Margins:  normal   PROCEDURE 2:  PROSTATE BIOPSY  A periprostatic block was performed using 2% lidocaine and transrectal ultrasound guidance. Under transrectal ultrasound guidance, and using the Biopty gun, prostate biopsies were obtained systematically from the apex, mid gland, and base bilaterally.  A total of 12 cores were obtained.  Hemostasis was obtained with gentle pressure on the prostate.  The procedures were well-tolerated.  No significant bleeding was noted at the end of the procedure.    Complications: None  Condition: Stable, extubated, transferred to PACU  Plan:  D/C home Follow-up for biopsy results

## 2021-09-17 NOTE — Anesthesia Postprocedure Evaluation (Signed)
Anesthesia Post Note  Patient: Jonathan Chase  Procedure(s) Performed: CYSTOSCOPY (Bladder) PROSTATE BIOPSY (Prostate)  Patient location during evaluation: Phase II Anesthesia Type: General Level of consciousness: awake and alert and oriented Pain management: pain level controlled Vital Signs Assessment: post-procedure vital signs reviewed and stable Respiratory status: spontaneous breathing, nonlabored ventilation and respiratory function stable Cardiovascular status: blood pressure returned to baseline and stable Postop Assessment: no apparent nausea or vomiting Anesthetic complications: no   No notable events documented.   Last Vitals:  Vitals:   09/17/21 1105 09/17/21 1133  BP: (!) 141/91 134/88  Pulse:  (!) 51  Resp: 12 16  Temp: 36.5 C 36.6 C  SpO2: 98% 95%    Last Pain:  Vitals:   09/17/21 1133  TempSrc: Oral  PainSc:                  [name] C Renette Hsu

## 2021-09-22 ENCOUNTER — Other Ambulatory Visit: Payer: Self-pay | Admitting: Urology

## 2021-09-22 ENCOUNTER — Encounter (HOSPITAL_COMMUNITY): Payer: Self-pay | Admitting: Urology

## 2021-09-22 DIAGNOSIS — C61 Malignant neoplasm of prostate: Secondary | ICD-10-CM

## 2021-09-24 ENCOUNTER — Telehealth: Payer: Self-pay

## 2021-09-24 NOTE — Telephone Encounter (Signed)
Patient called advising he rescheduled his PET Scan for Friday. He wanted to know if he should keep his appt tomorrow since you both spoke on the phone or reschedule for 7/13.

## 2021-09-24 NOTE — Telephone Encounter (Signed)
Patient called back.  Left a voice message 09-24-21.  Call back to let pt know on previous telephone note.  Thanks, Helene Kelp

## 2021-09-25 ENCOUNTER — Ambulatory Visit: Payer: Medicare Other | Admitting: Urology

## 2021-09-25 ENCOUNTER — Other Ambulatory Visit (HOSPITAL_COMMUNITY): Payer: Medicare Other

## 2021-09-25 NOTE — Telephone Encounter (Signed)
Patient rescheduled for 7/13

## 2021-09-26 ENCOUNTER — Encounter (HOSPITAL_COMMUNITY): Payer: Self-pay

## 2021-09-26 ENCOUNTER — Encounter (HOSPITAL_COMMUNITY): Payer: Medicare Other

## 2021-10-03 ENCOUNTER — Ambulatory Visit (HOSPITAL_COMMUNITY): Payer: Medicare Other

## 2021-10-06 ENCOUNTER — Other Ambulatory Visit: Payer: Self-pay

## 2021-10-06 DIAGNOSIS — C61 Malignant neoplasm of prostate: Secondary | ICD-10-CM

## 2021-10-09 ENCOUNTER — Encounter: Payer: Self-pay | Admitting: Urology

## 2021-10-09 ENCOUNTER — Ambulatory Visit: Payer: Medicare Other | Admitting: Urology

## 2021-10-09 VITALS — BP 147/86 | HR 67

## 2021-10-09 DIAGNOSIS — R310 Gross hematuria: Secondary | ICD-10-CM

## 2021-10-09 DIAGNOSIS — C61 Malignant neoplasm of prostate: Secondary | ICD-10-CM

## 2021-10-09 DIAGNOSIS — N403 Nodular prostate with lower urinary tract symptoms: Secondary | ICD-10-CM | POA: Diagnosis not present

## 2021-10-09 DIAGNOSIS — R3915 Urgency of urination: Secondary | ICD-10-CM | POA: Diagnosis not present

## 2021-10-09 NOTE — Progress Notes (Signed)
Subjective: 1. Prostate cancer ([place])   2. Nodular prostate with lower urinary tract symptoms   3. Urgency of urination   4. Gross hematuria      10/09/21: Jonathan Chase returns today in f/u to discuss his recent prostate biopsy results.  He had cystocopy for hematuria and a biopsy for an elevated PSA with prostate induration at the right base on 09/17/21.  He was found to have a 38 ml prostate with GG5 (Gl 9) in the RBM, RBL and RMM cores with 80-90% involvement and GG2 in the RML core.   He had a CT hematuria study on 08/08/21 and no obvious mets were seen but there was a 68m pulmonary nodule that was felt to be unlikely to be associated with the cancer.  He is to have a bone scan tomorrow and has a PMSA PET ordered.  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  He has mild LUTS and good erectile function.    08/07/21: Jonathan Chase a 71yo male who had hematuria with right flank pain on 01/22/21 but didn't pass the stone.   He had another episode on 07/24/21 of right flank pain and hematuria and again didn't pass a stone.  He has had some mild left flank pain since then.  He has persistent mild nausea.  He has some urgency but minimal frequency and no nocturia.  His IPSS is 4.  He passed stones in 7/96 and again in 2004-5.  He has had prior prostatitis but none in a long time.  His PSA was 5.4 on 05/26/21 and it was 3.8 in 2022 and 2.2 in 2021.    ROS:  Review of Systems  HENT:  Positive for congestion.   Musculoskeletal:  Positive for back pain and joint pain.    Allergies  Allergen Reactions   Lortab [Hydrocodone-Acetaminophen] Anaphylaxis   Robaxin [Methocarbamol] Anaphylaxis   Statins Other (See Comments)    depleted platelets count in blood    Past Medical History:  Diagnosis Date   High cholesterol    Inguinal hernia    Kidney stones     Past Surgical History:  Procedure Laterality Date   CYSTOSCOPY N/A 09/17/2021   Procedure: CYSTOSCOPY;  Surgeon: SPrimus Bravo, MD;  Location: [facility];   Service: Urology;  Laterality: N/A;   FEMUR SURGERY Right 03/30/1965   INGUINAL HERNIA REPAIR Right    PROSTATE BIOPSY N/A 09/17/2021   Procedure: PROSTATE BIOPSY;  Surgeon: SPrimus Bravo, MD;  Location: [facility];  Service: Urology;  Laterality: N/A;   TOE SURGERY Right    second toe    Social History   Socioeconomic History   Marital status: Married    Spouse name: Not on file   Number of children: Not on file   Years of education: Not on file   Highest education level: Not on file  Occupational History   Not on file  Tobacco Use   Smoking status: Former    Types: Cigars    Quit date: 09/02/1988    Years since quitting: 33.1   Smokeless tobacco: Never  Vaping Use   Vaping Use: Never used  Substance and Sexual Activity   Alcohol use: No   Drug use: No   Sexual activity: Not Currently  Other Topics Concern   Not on file  Social History Narrative   Not on file   Social Determinants of Health   Financial Resource Strain: Not on file  Food Insecurity: Not on file  Transportation Needs: Not on file  Physical Activity: Not on file  Stress: Not on file  Social Connections: Not on file  Intimate Partner Violence: Not on file    Family History  Problem Relation Age of Onset   Aortic aneurysm Father     Anti-infectives: Anti-infectives (From admission, onward)    None       Current Outpatient Medications  Medication Sig Dispense Refill   acetaminophen (TYLENOL) 500 MG tablet Take 500 mg by mouth daily as needed for moderate pain or mild pain. Gel cap Take with 400 mg ibuprofen     cetirizine (ZYRTEC) 10 MG tablet Take 10 mg by mouth at bedtime.     EPINEPHrine 0.3 mg/0.3 mL IJ SOAJ injection Inject 0.3 mg into the muscle as needed for anaphylaxis.     fluticasone (FLONASE) 50 MCG/ACT nasal spray Place 1 spray into both nostrils at bedtime.     ibuprofen (ADVIL,MOTRIN) 200 MG tablet Take 400 mg by mouth every 6 (six) hours as needed for pain, mild pain or  moderate pain. Take with 500 mg Aceteminophen     Multiple Vitamins-Minerals (MULTIVITAMIN WITH MINERALS) tablet Take 1 tablet by mouth daily. Men's 50+     Omega-3 Fatty Acids (FISH OIL TRIPLE STRENGTH PO) Take 2 capsules by mouth every morning. 900 mg omega 3 1400 mg fish oil     psyllium (HYDROCIL/METAMUCIL) 95 % PACK Take 1 packet by mouth daily.     No current facility-administered medications for this visit.     Objective: Vital signs in last 24 hours: BP (!) 147/86   Pulse 67   Intake/Output from previous day: No intake/output data recorded. Intake/Output this shift: '@IOTHISSHIFT'$ @   Physical Exam Vitals reviewed.  Constitutional:      Appearance: Normal appearance. He is obese.  Neurological:     Mental Status: He is alert.     Lab Results:  No results found for this or any previous visit (from the past 24 hour(s)).   BMET No results for input(s): "NA", "K", "CL", "CO2", "GLUCOSE", "BUN", "CREATININE", "CALCIUM" in the last 72 hours. PT/INR No results for input(s): "LABPROT", "INR" in the last 72 hours. ABG No results for input(s): "PHART", "HCO3" in the last 72 hours.  Invalid input(s): "PCO2", "PO2" UA has 0-2 RBC's. PSA  2021 2.2           2022 3.8           2023 5.4 Cr 0.96 on 05/26/20. CBC, CMP, Lipid and Hgb A1c reviewed.  Studies/Results: No results found. Path report reviewed and noted above.    CT report reviewed.    Assessment/Plan: Gross hematuria: W/U was negative.   2.    Prostate cancer:  He has High Risk T2a N0 M? Prostate cancer with a bone scan pending.  He will need to be considered for active therapy and I reviewed the options including RALP and EXRT w/wo seeds and ADT.  I discussed [employer] and fiducials as well.  He is most interested in RALP and I will get him referred to one of our robotic surgeons in [place].  He may be a candidate for the Proteus trial as well.   3.  41m pulmonary nodule on CT.   If the bone scan is  negative, I will consider reordering the PET scan.  No orders of the defined types were placed in this encounter.    Orders Placed This Encounter  Procedures   Ambulatory referral to Urology  Referral Priority:   Urgent    Referral Type:   Consultation    Referral Reason:   Specialty Services Required    Referred to Provider:   Alexis Frock, MD    Requested Specialty:   Urology    Number of Visits Requested:   1     Return for I am going to arrange for him to see one of my partners about robotic prostatectomy. .    CC: Dr. Julio Alm 10/09/2021 [phone]

## 2021-10-10 ENCOUNTER — Encounter (HOSPITAL_COMMUNITY)
Admission: RE | Admit: 2021-10-10 | Discharge: 2021-10-10 | Disposition: A | Discharge status: Home / Self Care | Payer: Medicare Other | Source: Ambulatory Visit | Attending: Urology | Admitting: Urology

## 2021-10-10 ENCOUNTER — Encounter (HOSPITAL_COMMUNITY): Payer: Self-pay

## 2021-10-10 DIAGNOSIS — C61 Malignant neoplasm of prostate: Secondary | ICD-10-CM | POA: Insufficient documentation

## 2021-10-10 HISTORY — DX: Malignant (primary) neoplasm, unspecified: C80.1

## 2021-10-10 MED ORDER — TECHNETIUM TC 99M MEDRONATE IV KIT
20.0000 | PACK | Freq: Once | INTRAVENOUS | Status: AC | PRN
Start: 2021-10-10 — End: 2021-10-10
  Administered 2021-10-10: 22 via INTRAVENOUS

## 2021-11-05 ENCOUNTER — Encounter (HOSPITAL_COMMUNITY)
Admission: RE | Admit: 2021-11-05 | Discharge: 2021-11-05 | Disposition: A | Discharge status: Home / Self Care | Payer: Medicare Other | Source: Ambulatory Visit | Attending: Urology | Admitting: Urology

## 2021-11-05 ENCOUNTER — Other Ambulatory Visit: Payer: Self-pay | Admitting: Urology

## 2021-11-05 DIAGNOSIS — C61 Malignant neoplasm of prostate: Secondary | ICD-10-CM | POA: Insufficient documentation

## 2021-11-05 MED ORDER — PIFLIFOLASTAT F 18 (PYLARIFY) INJECTION
9.0000 | Freq: Once | INTRAVENOUS | Status: AC
  Administered 2021-11-05: 9.6 via INTRAVENOUS

## 2021-11-06 ENCOUNTER — Telehealth: Payer: Self-pay

## 2021-11-06 NOTE — Telephone Encounter (Signed)
Patient made aware and patient voice understanding.

## 2021-11-06 NOTE — Telephone Encounter (Signed)
-----   Message from Irine Seal, MD sent at 11/06/2021 12:43 PM EDT ----- The PET scan shows no spread of the prostate cancer.   I have sent a note to Dr. Abner Greenspan who will be doing his surgery.  ----- Message ----- From: Sherrilyn Rist, CMA Sent: 11/05/2021   4:20 PM EDT To: Irine Seal, MD  Please review

## 2021-12-15 NOTE — Progress Notes (Signed)
Anesthesia Review:  PCP: Cardiologist : Chest x-ray : EKG : Echo : Stress test: Cardiac Cath :  Activity level:  Sleep Study/ CPAP : Fasting Blood Sugar :      / Checks Blood Sugar -- times a day:   Blood Thinner/ Instructions /Last Dose: ASA / Instructions/ Last Dose :  

## 2021-12-16 NOTE — Patient Instructions (Signed)
SURGICAL WAITING ROOM VISITATION Patients having surgery or a procedure may have no more than 2 support people in the waiting area - these visitors may rotate.   Children under the age of 41 must have an adult with them who is not the patient. If the patient needs to stay at the hospital during part of their recovery, the visitor guidelines for inpatient rooms apply. Pre-op nurse will coordinate an appropriate time for 1 support person to accompany patient in pre-op.  This support person may not rotate.    Please refer to the [facility] website for the visitor guidelines for Inpatients (after your surgery is over and you are in a regular room).       Your procedure is scheduled on:  12/26/21    Report to [facility] Main Entrance    Report to admitting at   [phone]   Call this number if you have problems the morning of surgery [phone]  Clear liquid diet the day before surgery.                 Mix 17 g of Miralax in 4 ounces of water and dirnk at 12 noon day before surgery.               Fleets enema nite before surgery.    After Midnight you may have the following liquids until _ 0845_____ AM  DAY OF SURGERY  Water Non-Citrus Juices (without pulp, NO RED) Carbonated Beverages Black Coffee (NO MILK/CREAM OR CREAMERS, sugar ok)  Clear Tea (NO MILK/CREAM OR CREAMERS, sugar ok) regular and decaf                             Plain Jell-O (NO RED)                                           Fruit ices (not with fruit pulp, NO RED)                                     Popsicles (NO RED)                                                               Sports drinks like Gatorade (NO RED)                            If you have questions, please contact your surgeon's office.   FOLLOW BOWEL PREP AND ANY ADDITIONAL PRE OP INSTRUCTIONS YOU RECEIVED FROM YOUR SURGEON'S OFFICE!!!     Oral Hygiene is also important to reduce your risk of infection.                                     Remember - BRUSH YOUR TEETH THE MORNING OF SURGERY WITH YOUR REGULAR TOOTHPASTE   Do NOT smoke after Midnight   Take these medicines the morning of surgery with A SIP OF  WATER:  none   DO NOT TAKE ANY ORAL DIABETIC MEDICATIONS DAY OF YOUR SURGERY  Bring CPAP mask and tubing day of surgery.                              You may not have any metal on your body including hair pins, jewelry, and body piercing             Do not wear make-up, lotions, powders, perfumes/cologne, or deodorant  Do not wear nail polish including gel and S&S, artificial/acrylic nails, or any other type of covering on natural nails including finger and toenails. If you have artificial nails, gel coating, etc. that needs to be removed by a nail salon please have this removed prior to surgery or surgery may need to be canceled/ delayed if the surgeon/ anesthesia feels like they are unable to be safely monitored.   Do not shave  48 hours prior to surgery.               Men may shave face and neck.   Do not bring valuables to the hospital. [place].   Contacts, dentures or bridgework may not be worn into surgery.   Bring small overnight bag day of surgery.   DO NOT [place]. PHARMACY WILL DISPENSE MEDICATIONS LISTED ON YOUR MEDICATION LIST TO YOU DURING YOUR ADMISSION [place]!    Patients discharged on the day of surgery will not be allowed to drive home.  Someone NEEDS to stay with you for the first 24 hours after anesthesia.   Special Instructions: Bring a copy of your healthcare power of attorney and living will documents the day of surgery if you haven't scanned them before.              Please read over the following fact sheets you were given: IF [place] [phone]   If you received a COVID test during your pre-op visit  it is requested that you wear a mask  when out in public, stay away from anyone that may not be feeling well and notify your surgeon if you develop symptoms. If you test positive for Covid or have been in contact with anyone that has tested positive in the last 10 days please notify you surgeon.     [facility] - Preparing for Surgery Before surgery, you can play an important role.  Because skin is not sterile, your skin needs to be as free of germs as possible.  You can reduce the number of germs on your skin by washing with CHG (chlorahexidine gluconate) soap before surgery.  CHG is an antiseptic cleaner which kills germs and bonds with the skin to continue killing germs even after washing. Please DO NOT use if you have an allergy to CHG or antibacterial soaps.  If your skin becomes reddened/irritated stop using the CHG and inform your nurse when you arrive at Short Stay. Do not shave (including legs and underarms) for at least 48 hours prior to the first CHG shower.  You may shave your face/neck. Please follow these instructions carefully:  1.  Shower with CHG Soap the night before surgery and the  morning of Surgery.  2.  If you choose to wash your hair, wash  your hair first as usual with your  normal  shampoo.  3.  After you shampoo, rinse your hair and body thoroughly to remove the  shampoo.                           4.  Use CHG as you would any other liquid soap.  You can apply chg directly  to the skin and wash                       Gently with a scrungie or clean washcloth.  5.  Apply the CHG Soap to your body ONLY FROM THE NECK DOWN.   Do not use on face/ open                           Wound or open sores. Avoid contact with eyes, ears mouth and genitals (private parts).                       Wash face,  Genitals (private parts) with your normal soap.             6.  Wash thoroughly, paying special attention to the area where your surgery  will be performed.  7.  Thoroughly rinse your body with warm water from the neck  down.  8.  DO NOT shower/wash with your normal soap after using and rinsing off  the CHG Soap.                9.  Pat yourself dry with a clean towel.            10.  Wear clean pajamas.            11.  Place clean sheets on your bed the night of your first shower and do not  sleep with pets. Day of Surgery : Do not apply any lotions/deodorants the morning of surgery.  Please wear clean clothes to the hospital/surgery center.  FAILURE TO FOLLOW THESE INSTRUCTIONS MAY RESULT IN THE CANCELLATION OF YOUR SURGERY PATIENT SIGNATURE_________________________________  NURSE SIGNATURE__________________________________  ________________________________________________________________________

## 2021-12-17 ENCOUNTER — Other Ambulatory Visit: Payer: Self-pay

## 2021-12-17 ENCOUNTER — Encounter (HOSPITAL_COMMUNITY)
Admission: RE | Admit: 2021-12-17 | Discharge: 2021-12-17 | Disposition: A | Discharge status: Home / Self Care | Payer: Medicare Other | Source: Ambulatory Visit | Attending: Urology | Admitting: Urology

## 2021-12-17 ENCOUNTER — Encounter (HOSPITAL_COMMUNITY): Payer: Self-pay

## 2021-12-17 VITALS — BP 145/87 | HR 61 | Temp 98.4°F | Resp 16 | Ht 69.0 in | Wt 256.0 lb

## 2021-12-17 DIAGNOSIS — Z01812 Encounter for preprocedural laboratory examination: Secondary | ICD-10-CM | POA: Diagnosis not present

## 2021-12-17 DIAGNOSIS — Z01818 Encounter for other preprocedural examination: Secondary | ICD-10-CM

## 2021-12-17 HISTORY — DX: Sleep apnea, unspecified: G47.30

## 2021-12-17 HISTORY — DX: Personal history of urinary calculi: Z87.442

## 2021-12-17 LAB — NO BLOOD PRODUCTS

## 2021-12-17 NOTE — Progress Notes (Addendum)
PT is a Jehovah witness  Blood Product Refusal form faxed to Dr Abner Greenspan office along with [employer] directive.  Fax confirmation received.   Blood Product Refusal Forme faxed to [facility].  Fax confirmation received.  Placed in allergies  Already noted in [place]  ORder placed  in epic.   [employer] directive on front of chart.

## 2021-12-26 ENCOUNTER — Encounter (HOSPITAL_COMMUNITY): Payer: Self-pay | Admitting: Urology

## 2021-12-26 ENCOUNTER — Ambulatory Visit (HOSPITAL_BASED_OUTPATIENT_CLINIC_OR_DEPARTMENT_OTHER): Payer: Medicare Other | Admitting: Anesthesiology

## 2021-12-26 ENCOUNTER — Observation Stay (HOSPITAL_COMMUNITY)
Admission: RE | Admit: 2021-12-26 | Discharge: 2021-12-27 | Disposition: A | Discharge status: Home / Self Care | Payer: Medicare Other | Source: Ambulatory Visit | Attending: Urology | Admitting: Urology

## 2021-12-26 ENCOUNTER — Ambulatory Visit (HOSPITAL_COMMUNITY): Payer: Medicare Other | Admitting: Physician Assistant

## 2021-12-26 ENCOUNTER — Encounter (HOSPITAL_COMMUNITY)
Admission: RE | Disposition: A | Discharge status: Home / Self Care | Payer: Self-pay | Source: Ambulatory Visit | Attending: Urology

## 2021-12-26 DIAGNOSIS — C61 Malignant neoplasm of prostate: Secondary | ICD-10-CM

## 2021-12-26 DIAGNOSIS — Z87891 Personal history of nicotine dependence: Secondary | ICD-10-CM | POA: Diagnosis not present

## 2021-12-26 DIAGNOSIS — Z01818 Encounter for other preprocedural examination: Secondary | ICD-10-CM

## 2021-12-26 HISTORY — PX: LYMPHADENECTOMY: SHX5960

## 2021-12-26 HISTORY — PX: ROBOT ASSISTED LAPAROSCOPIC RADICAL PROSTATECTOMY: SHX5141

## 2021-12-26 LAB — CBC WITH DIFFERENTIAL/PLATELET
Abs Immature Granulocytes: 0.01 10*3/uL (ref 0.00–0.07)
Basophils Absolute: 0 10*3/uL (ref 0.0–0.1)
Basophils Relative: 1 %
Eosinophils Absolute: 0 10*3/uL (ref 0.0–0.5)
Eosinophils Relative: 1 %
HCT: 45.1 % (ref 39.0–52.0)
Hemoglobin: 14.9 g/dL (ref 13.0–17.0)
Immature Granulocytes: 0 %
Lymphocytes Relative: 36 %
Lymphs Abs: 1.8 10*3/uL (ref 0.7–4.0)
MCH: 30.9 pg (ref 26.0–34.0)
MCHC: 33 g/dL (ref 30.0–36.0)
MCV: 93.6 fL (ref 80.0–100.0)
Monocytes Absolute: 0.5 10*3/uL (ref 0.1–1.0)
Monocytes Relative: 10 %
Neutro Abs: 2.6 10*3/uL (ref 1.7–7.7)
Neutrophils Relative %: 52 %
Platelets: 131 10*3/uL — ABNORMAL LOW (ref 150–400)
RBC: 4.82 MIL/uL (ref 4.22–5.81)
RDW: 13.1 % (ref 11.5–15.5)
WBC: 4.9 10*3/uL (ref 4.0–10.5)
nRBC: 0 % (ref 0.0–0.2)

## 2021-12-26 LAB — BASIC METABOLIC PANEL
Anion gap: 8 (ref 5–15)
BUN: 19 mg/dL (ref 8–23)
CO2: 26 mmol/L (ref 22–32)
Calcium: 9.5 mg/dL (ref 8.9–10.3)
Chloride: 108 mmol/L (ref 98–111)
Creatinine, Ser: 0.89 mg/dL (ref 0.61–1.24)
GFR, Estimated: >60 mL/min (ref >60)
Glucose, Bld: 101 mg/dL — ABNORMAL HIGH (ref 70–99)
Potassium: 3.8 mmol/L (ref 3.5–5.1)
Sodium: 142 mmol/L (ref 135–145)

## 2021-12-26 LAB — HEMOGLOBIN AND HEMATOCRIT, BLOOD
HCT: 42.8 % (ref 39.0–52.0)
Hemoglobin: 14 g/dL (ref 13.0–17.0)

## 2021-12-26 SURGERY — PROSTATECTOMY, RADICAL, ROBOT-ASSISTED, LAPAROSCOPIC
Anesthesia: General

## 2021-12-26 MED ORDER — SODIUM CHLORIDE 0.9% FLUSH
INTRAVENOUS | Status: DC | PRN
  Administered 2021-12-26: 20 mL

## 2021-12-26 MED ORDER — ACETAMINOPHEN 10 MG/ML IV SOLN
1000.0000 mg | Freq: Once | INTRAVENOUS | Status: DC | PRN
  Administered 2021-12-26: 1000 mg via INTRAVENOUS

## 2021-12-26 MED ORDER — MIDAZOLAM HCL 2 MG/2ML IJ SOLN
INTRAMUSCULAR | Status: AC
  Filled 2021-12-26: qty 2

## 2021-12-26 MED ORDER — ROCURONIUM BROMIDE 10 MG/ML (PF) SYRINGE
PREFILLED_SYRINGE | INTRAVENOUS | Status: DC | PRN
  Administered 2021-12-26: 20 mg via INTRAVENOUS
  Administered 2021-12-26: 60 mg via INTRAVENOUS
  Administered 2021-12-26: 10 mg via INTRAVENOUS
  Administered 2021-12-26: 40 mg via INTRAVENOUS

## 2021-12-26 MED ORDER — TRIPLE ANTIBIOTIC 3.5-400-5000 EX OINT: 1.0000 | TOPICAL_OINTMENT | Freq: Three times a day (TID) | CUTANEOUS | Status: DC | PRN

## 2021-12-26 MED ORDER — SULFAMETHOXAZOLE-TRIMETHOPRIM 800-160 MG PO TABS: 1.0000 | ORAL_TABLET | Freq: Two times a day (BID) | ORAL | 0 refills | Status: AC

## 2021-12-26 MED ORDER — CHLORHEXIDINE GLUCONATE 0.12 % MT SOLN
15.0000 mL | Freq: Once | OROMUCOSAL | Status: AC
  Administered 2021-12-26: 15 mL via OROMUCOSAL

## 2021-12-26 MED ORDER — ONDANSETRON HCL 4 MG/2ML IJ SOLN
INTRAMUSCULAR | Status: AC
  Filled 2021-12-26: qty 2

## 2021-12-26 MED ORDER — TRAMADOL HCL 50 MG PO TABS: 50.0000 mg | ORAL_TABLET | Freq: Four times a day (QID) | ORAL | Status: DC | PRN

## 2021-12-26 MED ORDER — SUGAMMADEX SODIUM 200 MG/2ML IV SOLN
INTRAVENOUS | Status: DC | PRN
  Administered 2021-12-26: 200 mg via INTRAVENOUS

## 2021-12-26 MED ORDER — FENTANYL CITRATE (PF) 100 MCG/2ML IJ SOLN
INTRAMUSCULAR | Status: AC
  Filled 2021-12-26: qty 2

## 2021-12-26 MED ORDER — ROCURONIUM BROMIDE 10 MG/ML (PF) SYRINGE
PREFILLED_SYRINGE | INTRAVENOUS | Status: AC
  Filled 2021-12-26: qty 10

## 2021-12-26 MED ORDER — HYDROMORPHONE HCL 1 MG/ML IJ SOLN: 0.5000 mg | INTRAMUSCULAR | Status: DC | PRN

## 2021-12-26 MED ORDER — CEFAZOLIN SODIUM-DEXTROSE 2-4 GM/100ML-% IV SOLN
2.0000 g | Freq: Once | INTRAVENOUS | Status: AC
  Administered 2021-12-26 (×2): 2 g via INTRAVENOUS
  Filled 2021-12-26: qty 100

## 2021-12-26 MED ORDER — CEFAZOLIN SODIUM-DEXTROSE 2-4 GM/100ML-% IV SOLN
INTRAVENOUS | Status: AC
  Filled 2021-12-26: qty 100

## 2021-12-26 MED ORDER — ACETAMINOPHEN 10 MG/ML IV SOLN
INTRAVENOUS | Status: AC
  Filled 2021-12-26: qty 100

## 2021-12-26 MED ORDER — LIDOCAINE HCL (PF) 2 % IJ SOLN
INTRAMUSCULAR | Status: AC
  Filled 2021-12-26: qty 5

## 2021-12-26 MED ORDER — ORAL CARE MOUTH RINSE: 15.0000 mL | Freq: Once | OROMUCOSAL | Status: AC

## 2021-12-26 MED ORDER — ONDANSETRON HCL 4 MG/2ML IJ SOLN
4.0000 mg | INTRAMUSCULAR | Status: DC | PRN
  Administered 2021-12-26: 4 mg via INTRAVENOUS
  Filled 2021-12-26: qty 2

## 2021-12-26 MED ORDER — DOCUSATE SODIUM 100 MG PO CAPS: 100.0000 mg | ORAL_CAPSULE | Freq: Two times a day (BID) | ORAL | Status: AC

## 2021-12-26 MED ORDER — FENTANYL CITRATE PF 50 MCG/ML IJ SOSY
25.0000 ug | PREFILLED_SYRINGE | INTRAMUSCULAR | Status: DC | PRN
  Administered 2021-12-26: 50 ug via INTRAVENOUS

## 2021-12-26 MED ORDER — ONDANSETRON HCL 4 MG/2ML IJ SOLN
INTRAMUSCULAR | Status: DC | PRN
  Administered 2021-12-26 (×2): 4 mg via INTRAVENOUS

## 2021-12-26 MED ORDER — TRAMADOL HCL 50 MG PO TABS: 50.0000 mg | ORAL_TABLET | Freq: Four times a day (QID) | ORAL | 0 refills | Status: AC | PRN

## 2021-12-26 MED ORDER — BUPIVACAINE LIPOSOME 1.3 % IJ SUSP
INTRAMUSCULAR | Status: DC | PRN
  Administered 2021-12-26: 40 mL

## 2021-12-26 MED ORDER — DIPHENHYDRAMINE HCL 50 MG/ML IJ SOLN: 12.5000 mg | Freq: Four times a day (QID) | INTRAMUSCULAR | Status: DC | PRN

## 2021-12-26 MED ORDER — AMISULPRIDE (ANTIEMETIC) 5 MG/2ML IV SOLN
INTRAVENOUS | Status: AC
  Filled 2021-12-26: qty 4

## 2021-12-26 MED ORDER — PROPOFOL 10 MG/ML IV BOLUS
INTRAVENOUS | Status: DC | PRN
  Administered 2021-12-26: 200 mg via INTRAVENOUS

## 2021-12-26 MED ORDER — LIDOCAINE 2% (20 MG/ML) 5 ML SYRINGE
INTRAMUSCULAR | Status: DC | PRN
  Administered 2021-12-26: 60 mg via INTRAVENOUS

## 2021-12-26 MED ORDER — PROPOFOL 10 MG/ML IV BOLUS
INTRAVENOUS | Status: AC
  Filled 2021-12-26: qty 20

## 2021-12-26 MED ORDER — DEXAMETHASONE SODIUM PHOSPHATE 10 MG/ML IJ SOLN
INTRAMUSCULAR | Status: DC | PRN
  Administered 2021-12-26: 10 mg via INTRAVENOUS

## 2021-12-26 MED ORDER — SODIUM CHLORIDE 0.9 % IV BOLUS: 1000.0000 mL | Freq: Once | INTRAVENOUS | Status: DC

## 2021-12-26 MED ORDER — MIDAZOLAM HCL 2 MG/2ML IJ SOLN
INTRAMUSCULAR | Status: DC | PRN
  Administered 2021-12-26: 2 mg via INTRAVENOUS

## 2021-12-26 MED ORDER — DEXAMETHASONE SODIUM PHOSPHATE 10 MG/ML IJ SOLN
INTRAMUSCULAR | Status: AC
  Filled 2021-12-26: qty 1

## 2021-12-26 MED ORDER — HYOSCYAMINE SULFATE 0.125 MG SL SUBL: 0.1250 mg | SUBLINGUAL_TABLET | SUBLINGUAL | Status: DC | PRN

## 2021-12-26 MED ORDER — FENTANYL CITRATE (PF) 100 MCG/2ML IJ SOLN
INTRAMUSCULAR | Status: DC | PRN
  Administered 2021-12-26 (×2): 50 ug via INTRAVENOUS
  Administered 2021-12-26: 100 ug via INTRAVENOUS
  Administered 2021-12-26 (×2): 25 ug via INTRAVENOUS
  Administered 2021-12-26: 50 ug via INTRAVENOUS

## 2021-12-26 MED ORDER — LACTATED RINGERS IV SOLN: INTRAVENOUS | Status: DC

## 2021-12-26 MED ORDER — LACTATED RINGERS IR SOLN
Status: DC | PRN
  Administered 2021-12-26: 1

## 2021-12-26 MED ORDER — ONDANSETRON HCL 4 MG/2ML IJ SOLN: 4.0000 mg | Freq: Once | INTRAMUSCULAR | Status: DC | PRN

## 2021-12-26 MED ORDER — SODIUM CHLORIDE (PF) 0.9 % IJ SOLN
INTRAMUSCULAR | Status: AC
  Filled 2021-12-26: qty 20

## 2021-12-26 MED ORDER — STERILE WATER FOR IRRIGATION IR SOLN
Status: DC | PRN
  Administered 2021-12-26: 1000 mL

## 2021-12-26 MED ORDER — FENTANYL CITRATE PF 50 MCG/ML IJ SOSY
PREFILLED_SYRINGE | INTRAMUSCULAR | Status: AC
  Filled 2021-12-26: qty 2

## 2021-12-26 MED ORDER — HEMOSTATIC AGENTS (NO CHARGE) OPTIME
TOPICAL | Status: DC | PRN
  Administered 2021-12-26: 1 via TOPICAL

## 2021-12-26 MED ORDER — EPHEDRINE SULFATE-NACL 50-0.9 MG/10ML-% IV SOSY
PREFILLED_SYRINGE | INTRAVENOUS | Status: DC | PRN
  Administered 2021-12-26: 5 mg via INTRAVENOUS
  Administered 2021-12-26 (×2): 10 mg via INTRAVENOUS

## 2021-12-26 MED ORDER — ACETAMINOPHEN 500 MG PO TABS
1000.0000 mg | ORAL_TABLET | Freq: Four times a day (QID) | ORAL | Status: DC
  Administered 2021-12-27: 1000 mg via ORAL
  Filled 2021-12-26: qty 2

## 2021-12-26 MED ORDER — GLYCOPYRROLATE 0.2 MG/ML IJ SOLN
INTRAMUSCULAR | Status: DC | PRN
  Administered 2021-12-26: .2 mg via INTRAVENOUS

## 2021-12-26 MED ORDER — SODIUM CHLORIDE 0.45 % IV SOLN: INTRAVENOUS | Status: DC

## 2021-12-26 MED ORDER — GLYCOPYRROLATE 0.2 MG/ML IJ SOLN
INTRAMUSCULAR | Status: AC
  Filled 2021-12-26: qty 1

## 2021-12-26 MED ORDER — FLEET ENEMA 7-19 GM/118ML RE ENEM
1.0000 | ENEMA | Freq: Once | RECTAL | Status: DC
  Filled 2021-12-26: qty 1

## 2021-12-26 MED ORDER — DOCUSATE SODIUM 100 MG PO CAPS
100.0000 mg | ORAL_CAPSULE | Freq: Two times a day (BID) | ORAL | Status: DC
  Administered 2021-12-27: 100 mg via ORAL
  Filled 2021-12-26: qty 1

## 2021-12-26 MED ORDER — POLYETHYLENE GLYCOL 3350 17 G PO PACK
17.0000 g | PACK | Freq: Every day | ORAL | Status: DC
  Filled 2021-12-26: qty 1

## 2021-12-26 MED ORDER — AMISULPRIDE (ANTIEMETIC) 5 MG/2ML IV SOLN
10.0000 mg | Freq: Once | INTRAVENOUS | Status: AC | PRN
  Administered 2021-12-26: 10 mg via INTRAVENOUS

## 2021-12-26 MED ORDER — FENTANYL CITRATE PF 50 MCG/ML IJ SOSY
25.0000 ug | PREFILLED_SYRINGE | INTRAMUSCULAR | Status: DC | PRN
  Administered 2021-12-27 (×2): 25 ug via INTRAVENOUS
  Filled 2021-12-26 (×2): qty 1

## 2021-12-26 MED ORDER — EPHEDRINE 5 MG/ML INJ
INTRAVENOUS | Status: AC
  Filled 2021-12-26: qty 5

## 2021-12-26 MED ORDER — BUPIVACAINE LIPOSOME 1.3 % IJ SUSP
INTRAMUSCULAR | Status: AC
  Filled 2021-12-26: qty 20

## 2021-12-26 MED ORDER — DIPHENHYDRAMINE HCL 12.5 MG/5ML PO ELIX: 12.5000 mg | ORAL_SOLUTION | Freq: Four times a day (QID) | ORAL | Status: DC | PRN

## 2021-12-26 SURGICAL SUPPLY — 71 items
APPLICATOR COTTON TIP 6 STRL (MISCELLANEOUS) ×2 IMPLANT
APPLICATOR COTTON TIP 6IN STRL (MISCELLANEOUS) ×2
APPLICATOR SURGIFLO ENDO (HEMOSTASIS) IMPLANT
BAG COUNTER SPONGE SURGICOUNT (BAG) IMPLANT
CATH FOLEY 2WAY SLVR  5CC 18FR (CATHETERS) ×2
CATH FOLEY 2WAY SLVR 5CC 18FR (CATHETERS) ×2 IMPLANT
CATH ROBINSON RED A/P 16FR (CATHETERS) ×2 IMPLANT
CATH SILICONE 5CC 18FR (INSTRUMENTS) ×2 IMPLANT
CHLORAPREP W/TINT 26 (MISCELLANEOUS) ×2 IMPLANT
CLIP LIGATING HEM O LOK PURPLE (MISCELLANEOUS) ×4 IMPLANT
COVER SURGICAL LIGHT HANDLE (MISCELLANEOUS) ×2 IMPLANT
COVER TIP SHEARS 8 DVNC (MISCELLANEOUS) ×2 IMPLANT
COVER TIP SHEARS 8MM DA VINCI (MISCELLANEOUS) ×2
CUTTER ECHEON FLEX ENDO 45 340 (ENDOMECHANICALS) IMPLANT
DERMABOND ADVANCED .7 DNX12 (GAUZE/BANDAGES/DRESSINGS) ×2 IMPLANT
DRAIN CHANNEL RND F F (WOUND CARE) IMPLANT
DRAPE ARM DVNC X/XI (DISPOSABLE) ×8 IMPLANT
DRAPE COLUMN DVNC XI (DISPOSABLE) ×2 IMPLANT
DRAPE DA VINCI XI ARM (DISPOSABLE) ×8
DRAPE DA VINCI XI COLUMN (DISPOSABLE) ×2
DRAPE SURG IRRIG POUCH 19X23 (DRAPES) ×2 IMPLANT
DRSG TEGADERM 4X4.75 (GAUZE/BANDAGES/DRESSINGS) IMPLANT
ELECT PENCIL ROCKER SW 15FT (MISCELLANEOUS) ×2 IMPLANT
ELECT REM PT RETURN 15FT ADLT (MISCELLANEOUS) ×2 IMPLANT
GAUZE 4X4 16PLY ~~LOC~~+RFID DBL (SPONGE) IMPLANT
GLOVE BIO SURGEON STRL SZ 6.5 (GLOVE) ×2 IMPLANT
GLOVE BIOGEL M 7.0 STRL (GLOVE) ×4 IMPLANT
GLOVE BIOGEL PI IND STRL 7.5 (GLOVE) ×4 IMPLANT
GOWN SRG XL LVL 4 BRTHBL STRL (GOWNS) ×2 IMPLANT
GOWN STRL NON-REIN XL LVL4 (GOWNS) ×2
GOWN STRL REUS W/ TWL XL LVL3 (GOWN DISPOSABLE) ×2 IMPLANT
GOWN STRL REUS W/TWL XL LVL3 (GOWN DISPOSABLE) ×2
HEMOSTAT POWDER SURGIFOAM 1G (HEMOSTASIS) IMPLANT
HEMOSTAT SURGICEL 4X8 (HEMOSTASIS) IMPLANT
HOLDER FOLEY CATH W/STRAP (MISCELLANEOUS) ×2 IMPLANT
IRRIG SUCT STRYKERFLOW 2 WTIP (MISCELLANEOUS) ×2
IRRIGATION SUCT STRKRFLW 2 WTP (MISCELLANEOUS) ×2 IMPLANT
IV LACTATED RINGERS 1000ML (IV SOLUTION) ×2 IMPLANT
KIT TURNOVER KIT A (KITS) IMPLANT
MARKER SKIN DUAL TIP RULER LAB (MISCELLANEOUS) ×2 IMPLANT
NDL INSUFFLATION 14GA 120MM (NEEDLE) ×2 IMPLANT
NEEDLE INSUFFLATION 14GA 120MM (NEEDLE) ×2 IMPLANT
PACK ROBOT UROLOGY CUSTOM (CUSTOM PROCEDURE TRAY) ×2 IMPLANT
PROTECTOR NERVE ULNAR (MISCELLANEOUS) ×2 IMPLANT
RELOAD STAPLE 45 4.1 GRN THCK (STAPLE) IMPLANT
SEAL CANN UNIV 5-8 DVNC XI (MISCELLANEOUS) ×8 IMPLANT
SEAL XI 5MM-8MM UNIVERSAL (MISCELLANEOUS) ×8
SET TUBE SMOKE EVAC HIGH FLOW (TUBING) ×2 IMPLANT
SOLUTION ELECTROLUBE (MISCELLANEOUS) ×2 IMPLANT
STAPLE RELOAD 45 GRN (STAPLE) ×2 IMPLANT
STAPLE RELOAD 45MM GREEN (STAPLE) ×2
SURGIFLO W/THROMBIN 8M KIT (HEMOSTASIS) IMPLANT
SUT ETHILON 2 0 PS N (SUTURE) IMPLANT
SUT MNCRL 3 0 VIOLET RB1 (SUTURE) IMPLANT
SUT MNCRL AB 4-0 PS2 18 (SUTURE) ×4 IMPLANT
SUT MONOCRYL 3 0 RB1 (SUTURE)
SUT PDS AB 0 CT1 36 (SUTURE) ×4 IMPLANT
SUT VIC AB 0 CT1 27 (SUTURE) ×6
SUT VIC AB 0 CT1 27XBRD ANTBC (SUTURE) ×4 IMPLANT
SUT VIC AB 0 CT2 27 (SUTURE) IMPLANT
SUT VIC AB 2-0 SH 27 (SUTURE) ×2
SUT VIC AB 2-0 SH 27XBRD (SUTURE) ×2 IMPLANT
SUT VIC AB 3-0 SH 27 (SUTURE)
SUT VIC AB 3-0 SH 27X BRD (SUTURE) IMPLANT
SUT VIC AB 4-0 RB1 27 (SUTURE) ×2
SUT VIC AB 4-0 RB1 27XBRD (SUTURE) IMPLANT
SUT VLOC 3-0 9IN GRN (SUTURE) IMPLANT
SUT VLOC BARB 180 ABS3/0GR12 (SUTURE) ×6
SUTURE VLOC BRB 180 ABS3/0GR12 (SUTURE) ×4 IMPLANT
TOWEL OR NON WOVEN STRL DISP B (DISPOSABLE) ×2 IMPLANT
WATER STERILE IRR 1000ML POUR (IV SOLUTION) ×2 IMPLANT

## 2021-12-26 NOTE — Discharge Instructions (Signed)

## 2021-12-26 NOTE — Anesthesia Procedure Notes (Signed)
Procedure Name: Intubation Date/Time: 12/26/2021 1:24 PM  Performed by: Lind Covert, CRNAPre-anesthesia Checklist: Patient identified, Emergency Drugs available, Suction available, Patient being monitored and Timeout performed Patient Re-evaluated:Patient Re-evaluated prior to induction Oxygen Delivery Method: Circle system utilized Preoxygenation: Pre-oxygenation with 100% oxygen Induction Type: IV induction Ventilation: Mask ventilation without difficulty Laryngoscope Size: Mac and 4 Grade View: Grade I Tube type: Oral Tube size: 7.5 mm Number of attempts: 1 Airway Equipment and Method: Stylet Placement Confirmation: ETT inserted through vocal cords under direct vision, positive ETCO2 and breath sounds checked- equal and bilateral Secured at: 23 cm Tube secured with: Tape Dental Injury: Teeth and Oropharynx as per pre-operative assessment

## 2021-12-26 NOTE — H&P (Signed)
Office Visit Report     12/09/2021   --------------------------------------------------------------------------------   Jonathan Chase  MRN: [id number]  DOB: 06/07/1950, 71 year old Male  SSN:    PRIMARY CARE:  Ihor Gully, MD  REFERRING:  Irine Seal, MD  PROVIDER:  Rexene Alberts, M.D.  TREATING:  [place], [place]  LOCATION:  [facility] [phone]     --------------------------------------------------------------------------------   CC/HPI: Pt presents today for pre-operative history and physical exam in anticipation of robotic assisted lap radical prostatectomy with bilateral pelvic lymph node dissection by Dr. Abner Greenspan on 12/26/21. He is doing well and is without complaint.   Pt denies F/C, HA, CP, SOB, N/V, diarrhea/constipation, back pain, flank pain, hematuria, and dysuria.     HX:   Jonathan Chase is a 71 year old male who is referred by Dr. Jeffie Pollock to discuss his new diagnosis of prostate cancer and definitive treatment options.   #1. High risk localized prostate cancer:  Patient underwent prostate biopsy on 09/17/2021 for an elevated PSA of 5.4 ng/mL. 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  -CT A/P 07/2021 with 5 mm indeterminate left lower lobe pulmonary nodule, no evidence of urinary tract neoplasm, tiny nonobstructing left renal stone.  -Bone scan 10/10/2021 NED  -PET PSMA scan has been ordered however pending.  Denies new or worsening bone or back pain. Good appetite and stable weight.   Family history: None  Imaging studies: CT and bone scan in 5-09/2021 with 5 mm indeterminate left lower pulmonary nodule   PMH: Hyperlipidemia, gout, Obstructive sleep apnea. No cardiac history. No anticoagulation.  PSH: Left inguinal hernia repair with mesh   TNM stage: [id number]  PSA: 5.4  Gleason score: 4+5 = 9  Biopsy: 09/17/2021  Left: BPH  only  Right: 4+5 = 9 in the right base, right lateral base, right mid;  Prostate volume: 38cc  PSAD: 0.13   Nomogram  CSS (15 years): 82%  PFS (5 year, 10 year): 40%, 26%  EPE: 77%a  LNI: 27%  SVI: 26%   IPSS: 10, QOL 2  SHIM: 24. He is not sexually active as he is caring for his wife with severe MS resulting in quadraplegia     ALLERGIES: Hydrocodone-Acetaminophen - Anaphylaxis, throat closed and stopped breathing methocarbamol - Anaphylaxis statins - thrombocytopenia    MEDICATIONS: Cetirizine Hcl 10 mg tablet  Epinephrine 0.3 mg/0.3 ml auto-injector  Fish Oil  Fluticasone Propionate 50 mcg/actuation spray, suspension  Ibuprofen  Metamucil  Multivitamin  Tylenol     GU PSH: Prostate Needle Biopsy - 09/17/2021       PSH Notes: Fractured Femur Repair - 1967  toe right foot 2006   NON-GU PSH: Inguinal hernia repair (laparoscopic) - 2005     GU PMH: Stress Incontinence - 11/24/2021, - 11/17/2021 Prostate Cancer - 10/30/2021, - 10/22/2021    NON-GU PMH: Muscle weakness (generalized) - 11/24/2021, - 11/17/2021, - 10/30/2021 Other muscle spasm - 11/24/2021, - 11/17/2021 Depression GERD Gout Hypercholesterolemia Hypertension Sleep Apnea    FAMILY HISTORY: 1 son - Runs in Family   SOCIAL HISTORY: Marital Status: Married Ethnicity: Not Hispanic Or Latino; Race: White Current Smoking Status: Patient has never smoked.   Tobacco Use Assessment Completed: Used Tobacco in last 30 days? Does not use smokeless tobacco. Has never drank.  Does not use drugs. Drinks 1 caffeinated drink per day. Has not had a blood  transfusion.     Notes: Jehovah's witness    REVIEW OF SYSTEMS:    GU Review Male:   Patient denies leakage of urine, burning/ pain with urination, stream starts and stops, erection problems, have to strain to urinate , frequent urination, trouble starting your stream, get up at night to urinate, penile pain, and hard to postpone urination.  Gastrointestinal  (Upper):   Patient denies nausea, vomiting, and indigestion/ heartburn.  Gastrointestinal (Lower):   Patient denies diarrhea and constipation.  Constitutional:   Patient denies fever, night sweats, weight loss, and fatigue.  Skin:   Patient denies skin rash/ lesion and itching.  Eyes:   Patient denies blurred vision and double vision.  Ears/ Nose/ Throat:   Patient denies sore throat and sinus problems.  Hematologic/Lymphatic:   Patient denies swollen glands and easy bruising.  Cardiovascular:   Patient denies leg swelling and chest pains.  Respiratory:   Patient denies cough and shortness of breath.  Endocrine:   Patient denies excessive thirst.  Musculoskeletal:   Patient denies back pain and joint pain.  Neurological:   Patient denies headaches and dizziness.  Psychologic:   Patient denies depression and anxiety.   VITAL SIGNS:      12/09/2021 03:42 PM  BP 152/84 mmHg  Pulse 64 /min  Temperature 97.5 F / 36.3 C   MULTI-SYSTEM PHYSICAL EXAMINATION:    Constitutional: Well-nourished. No physical deformities. Normally developed. Good grooming.  Neck: Neck symmetrical, not swollen. Normal tracheal position.  Respiratory: Normal breath sounds. No labored breathing, no use of accessory muscles.   Cardiovascular: Regular rate and rhythm. No murmur, no gallop.   Lymphatic: No enlargement of neck, axillae, groin.  Skin: No paleness, no jaundice, no cyanosis. No lesion, no ulcer, no rash.  Neurologic / Psychiatric: Oriented to time, oriented to place, oriented to person. No depression, no anxiety, no agitation.  Gastrointestinal: No mass, no tenderness, no rigidity, obese abdomen.   Eyes: Normal conjunctivae. Normal eyelids.  Ears, Nose, Mouth, and Throat: Left ear no scars, no lesions, no masses. Right ear no scars, no lesions, no masses. Nose no scars, no lesions, no masses. Normal hearing. Normal lips.  Musculoskeletal: Normal gait and station of head and neck.     Complexity of Data:   Records Review:   Previous Patient Records  Urine Test Review:   Urinalysis   12/09/21  Urinalysis  Urine Appearance Clear   Urine Color Yellow   Urine Glucose Neg mg/dL  Urine Bilirubin Neg mg/dL  Urine Ketones Neg mg/dL  Urine Specific Gravity 1.025   Urine Blood Neg ery/uL  Urine pH 6.0   Urine Protein Trace mg/dL  Urine Urobilinogen 1.0 mg/dL  Urine Nitrites Neg   Urine Leukocyte Esterase Neg leu/uL   PROCEDURES:          Urinalysis - 81003 Dipstick Dipstick Cont'd  Color: Yellow Bilirubin: Neg mg/dL  Appearance: Clear Ketones: Neg mg/dL  Specific Gravity: 1.025 Blood: Neg ery/uL  pH: 6.0 Protein: Trace mg/dL  Glucose: Neg mg/dL Urobilinogen: 1.0 mg/dL    Nitrites: Neg    Leukocyte Esterase: Neg leu/uL    ASSESSMENT:      ICD-10 Details  1 GU:   Prostate Cancer - C61    PLAN:           Schedule Return Visit/Planned Activity: Keep Scheduled Appointment - Schedule Surgery          Document Letter(s):  Created for Patient: Clinical Summary  Notes:   There are no changes in the patients history or physical exam since last evaluation by Dr. Abner Greenspan. Pt is scheduled to undergo RALP with BPLND on 12/26/21.   Pt is a Jehovah's witness and will not accept blood products.   All pt's questions were answered to the best of my ability.          Next Appointment:      Next Appointment: 12/26/2021 11:45 AM    Appointment Type: Surgery     Location: [facility] [phone]    Provider: Rexene Alberts, M.D.    Reason for Visit: WL/EXT REC RA LAP RAD Jonathan Chase    Urology Preoperative H&P   Chief Complaint: Prostate cancer  History of Present Illness: Jonathan Chase is a 71 y.o. male with prostate cancer here for robotic assisted laparoscopic radical prostatectomy with bilateral pelvic lymph node dissection.    Past Medical History:  Diagnosis Date   Cancer [facility])    prostate   High cholesterol    History of kidney  stones    Inguinal hernia    Sleep apnea    uses cpap    Past Surgical History:  Procedure Laterality Date   CYSTOSCOPY N/A 09/17/2021   Procedure: CYSTOSCOPY;  Surgeon: Primus Bravo., MD;  Location: [facility];  Service: Urology;  Laterality: N/A;   FEMUR SURGERY Right 03/30/1965   INGUINAL HERNIA REPAIR Right    PROSTATE BIOPSY N/A 09/17/2021   Procedure: PROSTATE BIOPSY;  Surgeon: Primus Bravo., MD;  Location: [facility];  Service: Urology;  Laterality: N/A;   TOE SURGERY Right    second toe    Allergies:  Allergies  Allergen Reactions   Lortab [Hydrocodone-Acetaminophen] Anaphylaxis   Robaxin [Methocarbamol] Anaphylaxis   Other     BLOOD PRODUCT REFUSAL    Statins Other (See Comments)    depleted platelets count in blood    Family History  Problem Relation Age of Onset   Aortic aneurysm Father     Social History:  reports that he quit smoking about 33 years ago. His smoking use included cigars. He has never used smokeless tobacco. He reports that he does not drink alcohol and does not use drugs.  ROS: A complete review of systems was performed.  All systems are negative except for pertinent findings as noted.  Physical Exam:  Vital signs in last 24 hours:   Constitutional:  Alert and oriented, No acute distress Cardiovascular: Regular rate and rhythm Respiratory: Normal respiratory effort, Lungs clear bilaterally GI: Abdomen is soft, nontender, nondistended, no abdominal masses GU: No CVA tenderness Lymphatic: No lymphadenopathy Neurologic: Grossly intact, no focal deficits Psychiatric: Normal mood and affect  Laboratory Data:  No results for input(s): "WBC", "HGB", "HCT", "PLT" in the last 72 hours.  No results for input(s): "NA", "K", "CL", "GLUCOSE", "BUN", "CALCIUM", "CREATININE" in the last 72 hours.  Invalid input(s): "CO3"   No results found for this or any previous visit (from the past 24 hour(s)). No results found for this or any previous  visit (from the past 240 hour(s)).  Renal Function: No results for input(s): "CREATININE" in the last 168 hours. CrCl cannot be calculated (Patient's most recent lab result is older than the maximum 21 days allowed.).  Radiologic Imaging: No results found.  I independently reviewed the above imaging studies.  Assessment and Plan Jonathan Chase is a 71 y.o. male with high risk localized prostate cancer here for robotic  assisted lap radical prostatectomy with bilateral pelvic lymph node dissection.    We again reviewed r/b of surgery including but not limited to impact on QOL-- most prominently on urinary, bowel, and sexual function. We discussed potential for short and long term urinary incontinence and erectile dysfunction. We discussed potential for surgical complications including infection, bleeding, blood transfusion, injury to urinary and surrounding structures including rectum which could require primary repair or diverting colostomy, vascular injury, neuromuscular injury related to positioning, penile shortening, and bladder neck contracture. We discussed the possibility of positive margins and possible need for adjuvant or salvage therapies in the future. We discussed other perioperative risks including DVT, PE, CVA, MI, pneumonia, and death. He is well informed and all questions were answered.   Jonathan R. Ferdinand Revoir MD 12/26/2021, 7:11 AM  [facility] Pager: [phone]

## 2021-12-26 NOTE — Op Note (Signed)
Operative Note  Preoperative diagnosis:  1.  Localized prostate cancer  Postoperative diagnosis: 1.  Localized prostate cancer  Procedure(s): 1.  Robotic assisted laparoscopic radical prostatectomy (non-nerve sparing) 2.  Robotic assisted laparoscopic bilateral pelvic lymph node dissection  Surgeon: Rexene Alberts, MD  Assistants:   Debbrah Alar, PA  An assistant was required for this surgical procedure.  The duties of the assistant included but were not limited to suctioning, passing suture, camera manipulation, retraction.  This procedure would not be able to be performed without an Environmental consultant.   Anesthesia:  General  Complications:  None  EBL:  311m  Specimens: 1.  Prostate with seminal vesicles 2.  Periprostatic fat 3. Bilateral pelvic lymph nodes  Drains/Catheters: 1.  18 French Foley catheter  Intraoperative findings:   Approximately 45cc prostate. High grade disease with difficult posterior plane. No accessory pudendal vessels.  Excellent hemostasis.  Water-tight anastomosis without leak.  Indication:  Jonathan BROADWATERis a 71y.o. male who has high risk localized prostate cancer. Patient underwent prostate biopsy on 09/17/2021 for an elevated PSA of 5.4 ng/mL. 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 CT A/P 07/2021 with 5 mm indeterminate left lower lobe pulmonary nodule, no evidence of urinary tract neoplasm, tiny nonobstructing left renal stone. Bone scan 10/10/2021 NED. PSMA PET scan without metastatic disease. Treatment options were discussed with him at length and he chose robotic assisted laparoscopic radical prostatectomy.   Description of procedure: The indications, alternatives, benefits, and risks were discussed with the patient and informed symptoms obtained.  The patient was brought to the operating room table, positioned supine and secured to the  bed with a safety strap.  All pressure points were carefully padded and pneumatic compression devices were placed on lower extremities.  After the administration of intravenous antibiotics and general endotracheal anesthesia, the patient was repositioned in the dorsal lithotomy position using well-padded Allen stirrups.  The arms were carefully tucked at the patient's side and secured with padding.  The chest was secured in place with foam padding and cloth tape and the table was positioned in approximately 30 degree Trendelenburg.  A rectal exam showed the prostate to be 40cc, with nodule on the right and not fixed. The patient's abdomen, genitalia, and upper thighs were prepped and draped in the standard sterile manner.  A time was completed, verifying the correct patient, surgical procedure and positioning prior to beginning the procedure.  An 169French urethral catheter was inserted to drain the bladder.  Pneumoperitoneum was introduced by placing a Veress needle into the abdomen superior to the umbilicus and insufflated with CO2 to a pressure of 15 mmHg. An 8 mm blunt tip trocar was placed just above the umbilicus.  The 0 degree camera was then passed under direct visualization.  The abdominal cavity was examined for any sign of injury, adhesions, and identification of anatomic landmarks.  The remainder of the trochars were placed which included 2 separate 8 millimeter robotic trochars which were placed 9 cm laterally and inferiorly to the initially placed camera trocar.  A 12 mm trocar was placed 8 mm lateral to the right robotic trocar.  A separate 8 mm robotic trocar was placed 8 cm lateral to the previously placed left robotic trocar on the left side.  A 5 mm trocar was placed to the right and well above the umbilicus which approximately 10 and  12 cm away from the right-sided trochars.  The robot was then docked.  I placed monopolar scissors in the right hand, a fenestrated bipolar in the left hand and a  prograsp in the fourth arm.  The urachus and median umbilical ligament was divided and we developed the space of Retzius down to the pubic bone.  I divided the parietal peritoneum laterally up to the vas deferens on each side.  Using the prograsp forcep to provide cranial traction on the urachus, the prostate was then defatted above the prostatic vesicle junction, and the superficial dorsal venous complex was coagulated with bipolar and divided.  We submitted the periprostatic fat for pathological specimen.  The endopelvic fascia was sharply opened bilaterally and the levator muscle fibers were swept posterior laterally allowing for visualization of the deep dorsal venous complex and apex of the prostate.  The puboprostatic ligaments were sharply divided and care was taken to preserve the dorsal venous complex.  I secured the dorsal venous complex with a battery operated stapler. However, there was persistent bleeding and this was oversewn with a 0 Vicryl CT1 needle in a figure-of-eight fashion.  I then addressed the bladder neck with a 30 degree down lens. I identified the bladder neck by pulling a Foley catheter.  The fourth arm was applying cranial traction on the urachus.  I divided the anterior bladder neck musculature until I found the anterior bladder neck mucosa which was then incised. I identified the Foley catheter within, the balloon was deflated, we pulled the Foley catheter out into the operating field.  The assistant then used a grasper to apply traction to the Foley catheter and the surgical tech then placed a [place] on the catheter near the penis for optimal retraction.  I then divided the lateral bladder neck mucosa in the posterior bladder neck mucosa.  I was well away from the bilateral ureteral orifices.  I divided the posterior bladder neck musculature until I discovered the longitudinal fibers and kept dissecting until I found the vas deferens.  The bilateral vas deferens were then freed  and divided.  I then freed the bilateral seminal vesicles using blunt and sharp dissection.  I placed metal clips on the seminal vesicle vessels and avoided cautery around the seminal vesicles.  I then switched back to a 0 degree lens.  I divided the Denonvilliers fascia beneath the prostate and developed the prostate off the rectum. Of note, this was a difficult dissection as the plan was very stuck. It was quite tedious to develop this plan and safely remove the prostate without injuring the rectum.  This plane was bluntly developed towards the apex of the prostate.  I then addressed the pedicles, first starting with the right and then moving towards the left. I then isolated the pedicles of the prostate and placed Weck clips on the pedicles and then divided the pedicles with cold scissors.  At this point the prostate was essentially freed up except for the urethra.   I then addressed the prostate anteriorly, dividing the dorsal vein with cautery.  The anterior urethra was then sharply divided with cold scissors.  The Foley catheter was then pulled back and we divided the posterior urethral wall. Patent venous sinuses were then oversewn with a 4-0 Vicryl suture.  The specimen was then placed in a Endo Catch bag and then the bag was placed in the upper abdomen out of the way.  I then irrigated the pelvis.  We performed a rectal test by  instilling air into the rectal Foley.  The test was negative.  There is no concern for rectal injury.  I then over sewed a few bleeders alongside the pedicles with a 4-0 Vicryl suture on an RB1 needle.  I then performed a bilateral pelvic lymph node dissection by incising the fascia overlying the right external iliac vein, dissecting distally.  I went just distal to the node of Cloquet were replaced clips and then divided the lymphatics.  The lateral aspect of the dissection was the pelvic sidewall, inferior was the obturator nerve and proximal of the hypogastric vessels.  I  placed clips at the proximal aspect and then divided the lymphatics.  The specimen was removed with the scope grasper and sent to pathology.  Grossly, there were no enlarged lymph nodes.  This was performed on both the right and left pelvic lymph nodes.  With good hemostasis confirmed, I then did the posterior reconstruction with a Rocco stitch.  I used a 3-0 Vloc double-armed suture on an RB1 needle.  I passed the sutures through the cut edges of Denonvilliers fascia beneath the bladder on the right side and through the posterior serrated sphincter underneath the urethra.  I ran this from right to left.  And then took the other end of the suture passing just proximal to the posterior bladder neck in the midline into the posterior serrated sphincter and around this from right to left using 3 throws and reapproximated the sutures.  I then completed the urethrovesical anastomosis using a 3-0 Vloc suture on an RB1 needle.  I passed both ends of the suture from the outside in through the bladder neck at the 6 o'clock position.  I passed both through the urethral stump from the inside out and the corresponding position.  I reapproximated the bladder neck to the urethra.  I then ran the left suture on the left side anastomosis to the 9 o'clock position.  And then went back to the right-sided suture around that up the right side of the 12 o'clock position.  I then continued the left suture to the 12 o'clock position. I identified the ureteral orifices and ensured that these were not incorporated with the sutures.  I then placed a new 18 French Foley catheter into the bladder and filled it with 10 cc of sterile water.  I then secured the knot and then passed the suture behind the pubic bone for anterior suspension.  The bladder was irrigated with 200 cc of water.  There was no leak.  Hemostasis was excellent. I did place floseal and surgicel. A 15 French drain was placed through the previously placed fourth arm.  We  did place a Carter-Thomason 0 vicryl suture through the 12 mm assistant port. The robot was undocked and all the trochars were removed under direct vision.    I enlarged the umbilical trocar site large enough to remove the prostate and closed the fascia with 0 PDS sutures in a running fashion.  All the port sites were irrigated.  Exparel was injected to the trocar sites.  The skin was closed with 4-0 Monocryl in a running subcuticular fashion.  Skin glue was applied.  At this point, the patient was extubated and awakened in the operating room and taken to recovery room in stable condition.  There were no immediate complications.  All counts were correct.  Plan: Admit for observation overnight.  Clear liquids tonight.  Regular for breakfast tomorrow.  Anticipate discharge home tomorrow.  Matt R.  [facility]  Pager: [phone]

## 2021-12-26 NOTE — Progress Notes (Signed)
Patient experiencing "waves of nausea" and would like to use his home CPAP machine. At this time, CPAP is held due to nausea. He and the family at bedside agree. RT will continue to follow and encourage use as well as assist with his home machine hookup if if is available.

## 2021-12-26 NOTE — Anesthesia Preprocedure Evaluation (Addendum)
Anesthesia Evaluation  Patient identified by MRN, date of birth, ID band Patient awake    Reviewed: Allergy & Precautions, NPO status , Patient's Chart, lab work & pertinent test results  Airway Mallampati: I  TM Distance: >3 FB Neck ROM: Full    Dental no notable dental hx.    Pulmonary sleep apnea and Continuous Positive Airway Pressure Ventilation , former smoker,    Pulmonary exam normal        Cardiovascular negative cardio ROS Normal cardiovascular exam     Neuro/Psych negative neurological ROS  negative psych ROS   GI/Hepatic negative GI ROS, Neg liver ROS,   Endo/Other  negative endocrine ROS  Renal/GU negative Renal ROS     Musculoskeletal negative musculoskeletal ROS (+)   Abdominal (+) + obese,   Peds  Hematology  (+) REFUSES BLOOD PRODUCTS,   Anesthesia Other Findings PROSTATE CANCER  Reproductive/Obstetrics                            Anesthesia Physical Anesthesia Plan  ASA: 2  Anesthesia Plan: General   Post-op Pain Management:    Induction: Intravenous  PONV Risk Score and Plan: 2 and Ondansetron, Dexamethasone and Treatment may vary due to age or medical condition  Airway Management Planned: Oral ETT  Additional Equipment:   Intra-op Plan:   Post-operative Plan: Extubation in OR  Informed Consent: I have reviewed the patients History and Physical, chart, labs and discussed the procedure including the risks, benefits and alternatives for the proposed anesthesia with the patient or authorized representative who has indicated his/her understanding and acceptance.     Dental advisory given  Plan Discussed with: CRNA  Anesthesia Plan Comments:        Anesthesia Quick Evaluation

## 2021-12-26 NOTE — Transfer of Care (Signed)
Immediate Anesthesia Transfer of Care Note  Patient: Jonathan Chase  Procedure(s) Performed: XI ROBOTIC ASSISTED LAPAROSCOPIC RADICAL PROSTATECTOMY LYMPHADENECTOMY, PELVIC (Bilateral)  Patient Location: PACU  Anesthesia Type:General  Level of Consciousness: drowsy  Airway & Oxygen Therapy: Patient Spontanous Breathing and Patient connected to face mask oxygen  Post-op Assessment: Report given to RN and Post -op Vital signs reviewed and stable  Post vital signs: Reviewed and stable  Last Vitals:  Vitals Value Taken Time  BP 142/77 12/26/21 1840  Temp    Pulse 68 12/26/21 1844  Resp 11 12/26/21 1844  SpO2 98 % 12/26/21 1844  Vitals shown include unvalidated device data.  Last Pain:  Vitals:   12/26/21 1020  PainSc: 0-No pain         Complications: No notable events documented.

## 2021-12-26 NOTE — Anesthesia Postprocedure Evaluation (Signed)
Anesthesia Post Note  Patient: Jonathan Chase  Procedure(s) Performed: XI ROBOTIC ASSISTED LAPAROSCOPIC RADICAL PROSTATECTOMY LYMPHADENECTOMY, PELVIC (Bilateral)     Patient location during evaluation: PACU Anesthesia Type: General Level of consciousness: awake and alert Pain management: pain level controlled Vital Signs Assessment: post-procedure vital signs reviewed and stable Respiratory status: spontaneous breathing, nonlabored ventilation, respiratory function stable and patient connected to nasal cannula oxygen Cardiovascular status: blood pressure returned to baseline and stable Postop Assessment: no apparent nausea or vomiting Anesthetic complications: no   No notable events documented.  Last Vitals:  Vitals:   12/26/21 1845 12/26/21 1900  BP: (!) 141/87 (!) 154/80  Pulse: 64 71  Resp: (!) 8 13  Temp:    SpO2: 98% 99%    Last Pain:  Vitals:   12/26/21 1020  PainSc: 0-No pain                 Santa Lighter

## 2021-12-27 ENCOUNTER — Other Ambulatory Visit: Payer: Self-pay

## 2021-12-27 DIAGNOSIS — C61 Malignant neoplasm of prostate: Secondary | ICD-10-CM | POA: Diagnosis not present

## 2021-12-27 LAB — BASIC METABOLIC PANEL
Anion gap: 8 (ref 5–15)
BUN: 17 mg/dL (ref 8–23)
CO2: 26 mmol/L (ref 22–32)
Calcium: 8.4 mg/dL — ABNORMAL LOW (ref 8.9–10.3)
Chloride: 105 mmol/L (ref 98–111)
Creatinine, Ser: 0.87 mg/dL (ref 0.61–1.24)
GFR, Estimated: >60 mL/min (ref >60)
Glucose, Bld: 131 mg/dL — ABNORMAL HIGH (ref 70–99)
Potassium: 3.8 mmol/L (ref 3.5–5.1)
Sodium: 139 mmol/L (ref 135–145)

## 2021-12-27 LAB — HEMOGLOBIN AND HEMATOCRIT, BLOOD
HCT: 39.1 % (ref 39.0–52.0)
Hemoglobin: 13 g/dL (ref 13.0–17.0)

## 2021-12-27 LAB — CREATININE, FLUID (PLEURAL, PERITONEAL, JP DRAINAGE): Creat, Fluid: 0.8 mg/dL

## 2021-12-27 NOTE — Discharge Summary (Signed)
Physician Discharge Summary  Patient ID: Jonathan Chase MRN: [id number] DOB/AGE: 08/17/1950 71 y.o.  Admit date: 12/26/2021 Discharge date: 12/27/2021  Admission Diagnoses:  Discharge Diagnoses:  Principal Problem:   Prostate cancer [facility])   Discharged Condition: good  Hospital Course: Patient underwent robotic assisted laparoscopic radical prostatectomy, non-nerve sparing, bilateral pelvic lymph node dissection.  He tolerated the procedure well was stable postoperative.  The following day, his JP creatinine was consistent with serum.  Hemoglobin was stable.  He was ambulating well with pain well controlled and tolerating diet.  JP drain removed and discharged with Foley catheter in stable condition.  Consults: None  Significant Diagnostic Studies: labs: As above  Treatments: surgery: As above  Discharge Exam: Blood pressure 112/66, pulse (!) 53, temperature 97.6 F (36.4 C), temperature source Oral, resp. rate 18, height '5\' 9"'$  (1.753 m), weight 101.2 kg, SpO2 95 %. General appearance: alert no acute distress Adequate perfusion of extremities Nonlabored respiration Abdomen soft, appropriately tender, nondistended, incisions clean dry and intact, JP with small amount of serosanguineous fluid Catheter draining clear yellow urine  Disposition: Discharge disposition: 01-Home or Self Care        Allergies as of 12/27/2021       Reactions   Lortab [hydrocodone-acetaminophen] Anaphylaxis   Robaxin [methocarbamol] Anaphylaxis   Other    BLOOD PRODUCT REFUSAL    Statins Other (See Comments)   depleted platelets count in blood        Medication List     STOP taking these medications    FISH OIL TRIPLE STRENGTH PO   ibuprofen 200 MG tablet Commonly known as: ADVIL   multivitamin with minerals tablet       TAKE these medications    Acetaminophen 500 MG capsule Take 500 mg by mouth every 6 (six) hours as needed for moderate pain.   cetirizine 10 MG  tablet Commonly known as: ZYRTEC Take 10 mg by mouth at bedtime.   docusate sodium 100 MG capsule Commonly known as: COLACE Take 1 capsule (100 mg total) by mouth 2 (two) times daily.   EPINEPHrine 0.3 mg/0.3 mL Soaj injection Commonly known as: EPI-PEN Inject 0.3 mg into the muscle as needed for anaphylaxis.   fluticasone 50 MCG/ACT nasal spray Commonly known as: FLONASE Place 1 spray into both nostrils daily as needed for allergies.   psyllium 95 % Pack Commonly known as: HYDROCIL/METAMUCIL Take 1 packet by mouth daily.   sulfamethoxazole-trimethoprim 800-160 MG tablet Commonly known as: BACTRIM DS Take 1 tablet by mouth 2 (two) times daily. Start the day prior to foley removal appointment   traMADol 50 MG tablet Commonly known as: Ultram Take 1-2 tablets (50-100 mg total) by mouth every 6 (six) hours as needed for moderate pain or severe pain.        Follow-up Information     Janith Lima, MD Follow up.   Specialty: Urology Why: office will call you with date and time of appt. Contact information: [place] [address] [phone]                 Signed: Marton Redwood, III 12/27/2021, 10:12 AM

## 2021-12-28 ENCOUNTER — Encounter (HOSPITAL_COMMUNITY): Payer: Self-pay | Admitting: Urology

## 2022-01-02 LAB — SURGICAL PATHOLOGY

## 2022-12-18 ENCOUNTER — Encounter: Payer: Self-pay | Admitting: Orthopedic Surgery

## 2022-12-18 ENCOUNTER — Ambulatory Visit: Payer: Medicare Other | Admitting: Orthopedic Surgery

## 2022-12-18 ENCOUNTER — Other Ambulatory Visit (INDEPENDENT_AMBULATORY_CARE_PROVIDER_SITE_OTHER): Payer: Medicare Other

## 2022-12-18 ENCOUNTER — Other Ambulatory Visit: Payer: Self-pay

## 2022-12-18 VITALS — BP 152/96 | HR 69 | Ht 69.0 in | Wt 249.0 lb

## 2022-12-18 DIAGNOSIS — M25561 Pain in right knee: Secondary | ICD-10-CM

## 2022-12-18 DIAGNOSIS — M170 Bilateral primary osteoarthritis of knee: Secondary | ICD-10-CM | POA: Diagnosis not present

## 2022-12-18 DIAGNOSIS — M25562 Pain in left knee: Secondary | ICD-10-CM

## 2022-12-18 DIAGNOSIS — G8929 Other chronic pain: Secondary | ICD-10-CM

## 2022-12-18 DIAGNOSIS — M898X5 Other specified disorders of bone, thigh: Secondary | ICD-10-CM

## 2022-12-18 NOTE — Progress Notes (Signed)
Office Visit Note   Patient: Jonathan Chase           Date of Birth: Feb 04, 1951           MRN: [id number] Visit Date: 12/18/2022 Requested by: Alinda Deem, MD [address] [place],  [place] [address] PCP: Alinda Deem, MD   Assessment & Plan:   Encounter Diagnoses  Name Primary?  . Chronic pain of left knee Yes  . Chronic pain of right knee   . Pain in femur     No orders of the defined types were placed in this encounter.   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  His function is still good his range of motion is still good he has no medication for arthritis and he has no medication for chronic pain              ROS: Sleep apnea no shortness of breath easy bleeding chest pain or fever   Images personally read and my interpretation : Pelvic and femur x-rays show a large old-fashioned K nail  Minimal deformity of the femur nail abuts the anterior cortex of the right femur  Right knee varus osteoarthritis grade 4  Left knee osteoarthritis varus knee grade 4  Visit Diagnoses:  1. Chronic pain of left knee   2. Chronic pain of right knee   3.  Pain in femur      Follow-Up Instructions: Return if symptoms worsen or fail to improve.    Objective: Vital Signs: BP (!) 152/96   Pulse 69   Ht 5\' 9"  (1.753 m)   Wt 249 lb (112.9 kg)   BMI 36.77 kg/m   Physical Exam Vitals and nursing note reviewed.  Constitutional:      Appearance: Normal appearance.  HENT:     Head: Normocephalic and atraumatic.  Eyes:     General: No scleral icterus.       Right eye: No discharge.        Left eye: No discharge.     Extraocular Movements: Extraocular movements intact.     Conjunctiva/sclera: Conjunctivae normal.     Pupils: Pupils are equal, round, and reactive to light.  Cardiovascular:     Rate and Rhythm: Normal rate.     Pulses: Normal pulses.  Musculoskeletal:     Right knee: No effusion.     Left knee: No effusion.  Skin:    General: Skin is warm and dry.     Capillary Refill: Capillary refill takes less than 2 seconds.  Neurological:     General: No focal deficit present.  Mental Status: He is alert and oriented to person, place, and time.  Psychiatric:        Mood and Affect: Mood normal.        Behavior: Behavior normal.        Thought Content: Thought content normal.        Judgment: Judgment normal.     Right Knee Exam   Muscle Strength  The patient has normal right knee strength.  Tenderness  The patient is experiencing tenderness in the medial joint line.  Range of Motion  Extension:  abnormal  Flexion:  normal   Tests  Drawer:  Anterior - negative    Posterior - negative  Other  Erythema: absent Scars: absent Sensation: normal Pulse: present Swelling: none Effusion: no effusion present   Left Knee Exam   Muscle Strength  The patient has normal left knee strength.  Tenderness  The patient is experiencing tenderness in the medial joint line.  Range of Motion  Extension:  abnormal  Flexion:  normal   Tests  Drawer:  Anterior - negative     Posterior - negative  Other  Erythema:  absent Scars: absent Sensation: normal Pulse: present Swelling: none Effusion: no effusion present     Specialty Comments:  No specialty comments available.  Imaging: No results found.   PMFS History: Patient Active Problem List   Diagnosis Date Noted  . Prostate cancer (HCC) 12/26/2021   Past Medical History:  Diagnosis Date  . Cancer [facility])    prostate  . High cholesterol   . History of kidney stones   . Inguinal hernia   . Sleep apnea    uses cpap    Family History  Problem Relation Age of Onset  . Aortic aneurysm Father     Past Surgical History:  Procedure Laterality Date  . CYSTOSCOPY N/A 09/17/2021   Procedure: CYSTOSCOPY;  Surgeon: Milderd Meager., MD;  Location: [facility];  Service: Urology;  Laterality: N/A;  . FEMUR SURGERY Right 03/30/1965  . INGUINAL HERNIA REPAIR Right   . LYMPHADENECTOMY Bilateral 12/26/2021   Procedure: LYMPHADENECTOMY, PELVIC;  Surgeon: Jannifer Hick, MD;  Location: [facility];  Service: Urology;  Laterality: Bilateral;  . PROSTATE BIOPSY N/A 09/17/2021   Procedure: PROSTATE BIOPSY;  Surgeon: Milderd Meager., MD;  Location: [facility];  Service: Urology;  Laterality: N/A;  . ROBOT ASSISTED LAPAROSCOPIC RADICAL PROSTATECTOMY N/A 12/26/2021   Procedure: XI ROBOTIC ASSISTED LAPAROSCOPIC RADICAL PROSTATECTOMY;  Surgeon: Jannifer Hick, MD;  Location: [facility];  Service: Urology;  Laterality: N/A;  . TOE SURGERY Right    second toe   Social History   Occupational History  . Not on file  Tobacco Use  . Smoking status: Former    Types: Cigars    Quit date: 09/02/1988    Years since quitting: 34.3  . Smokeless tobacco: Never  Vaping Use  . Vaping status: Never Used  Substance and Sexual Activity  . Alcohol use: No  . Drug use: No  . Sexual activity: Not Currently

## 2023-06-06 IMAGING — CT CT ABD-PEL WO/W CM
3 of 15 series · 11 of 46 positions shown, 17 images · IV contrast (agent unspecified)
Comparison: None Available.

CLINICAL DATA: Gross hematuria. Right flank pain. Nephrolithiasis.

EXAM:
CT ABDOMEN AND PELVIS WITHOUT AND WITH CONTRAST
TECHNIQUE: Multidetector CT imaging of the abdomen and pelvis was performed
following the standard protocol before and following the bolus
administration of intravenous contrast.

[Series 2: thins pre · axial · non-contrast · 0.96mm/px · z∈[+830,+1270]mm · 7 of 1174 slices shown, 12 images]
[im 147/1174  soft-tissue]
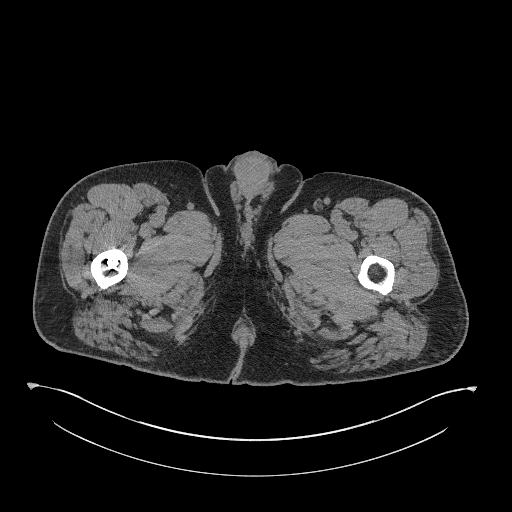
[im 147/1174  bone]
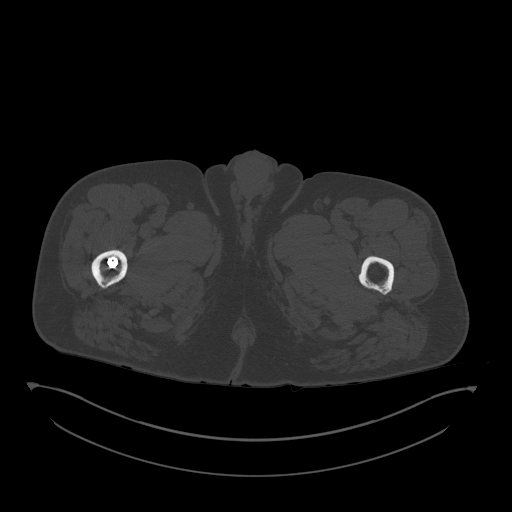
[im 294/1174  soft-tissue]
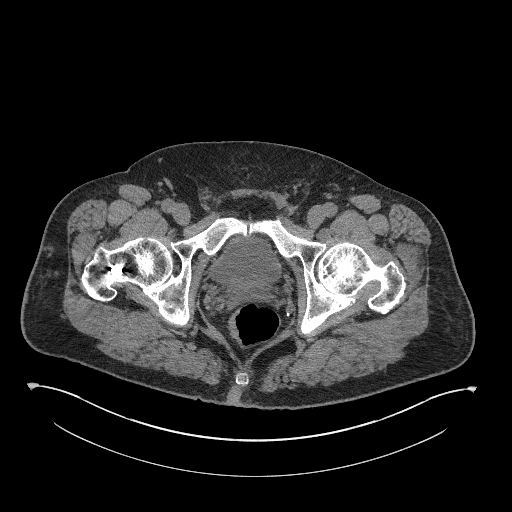
[im 440/1174  soft-tissue]
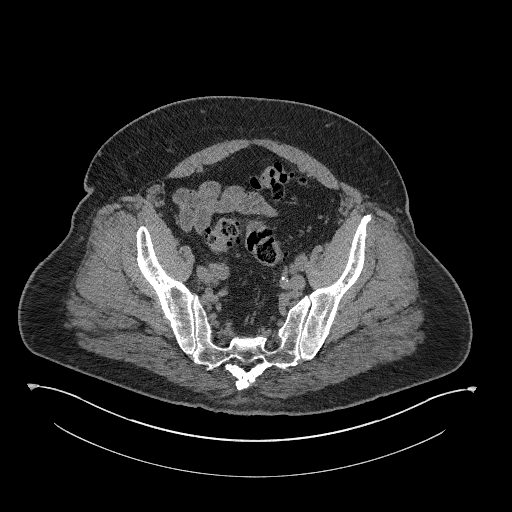
[im 587/1174  soft-tissue]
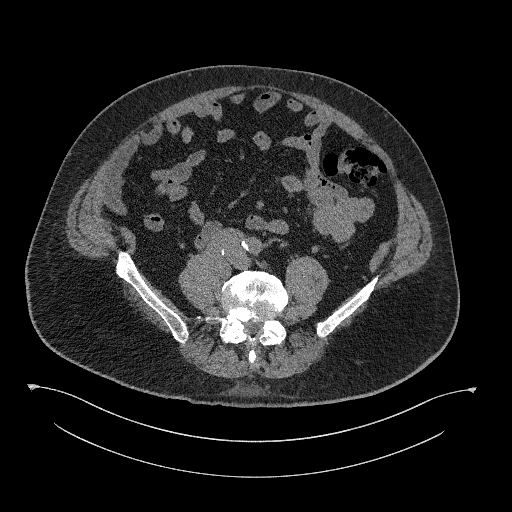
[im 587/1174  lung]
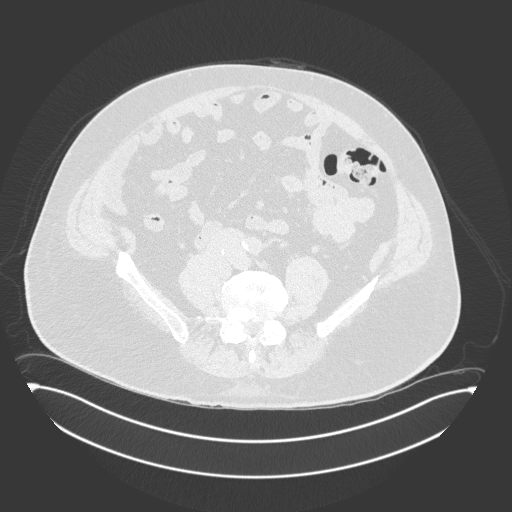
[im 734/1174  soft-tissue]
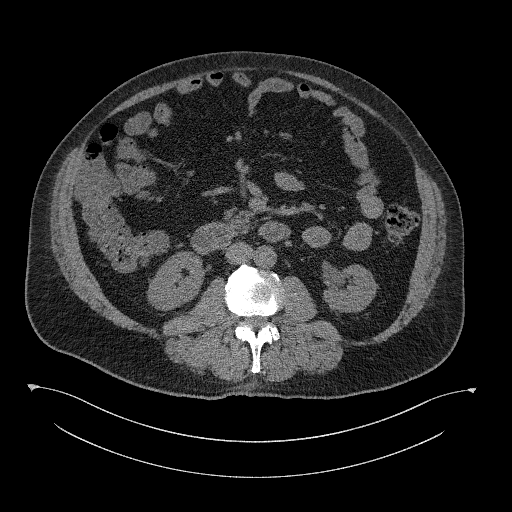
[im 734/1174  lung]
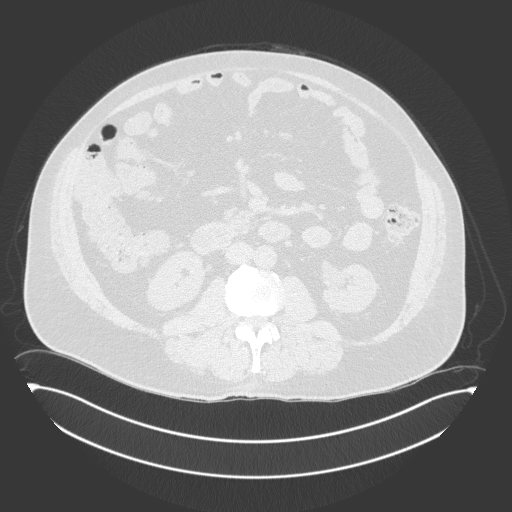
[im 880/1174  soft-tissue]
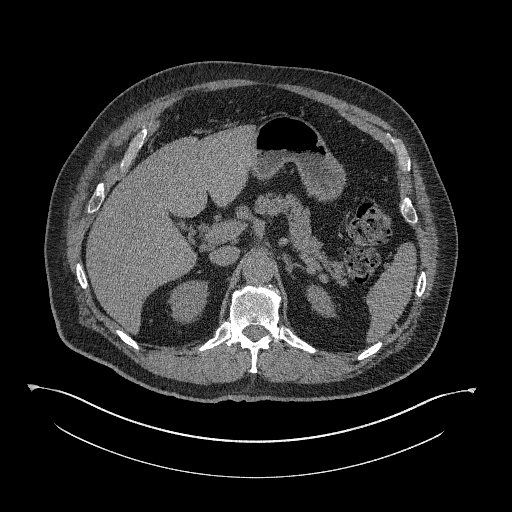
[im 880/1174  lung]
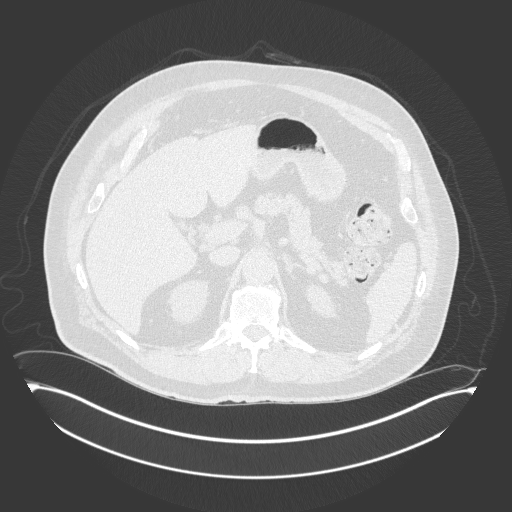
[im 1027/1174  soft-tissue]
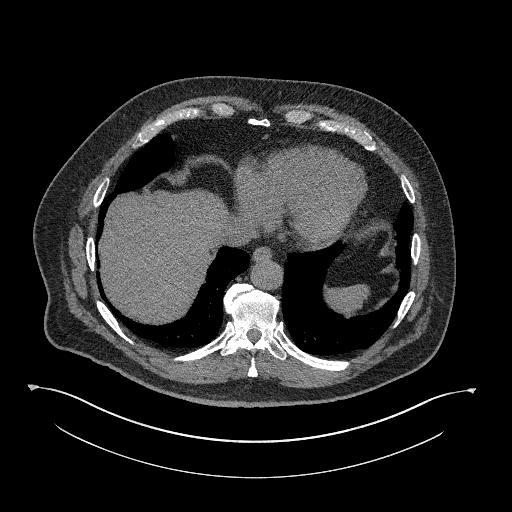
[im 1027/1174  lung]
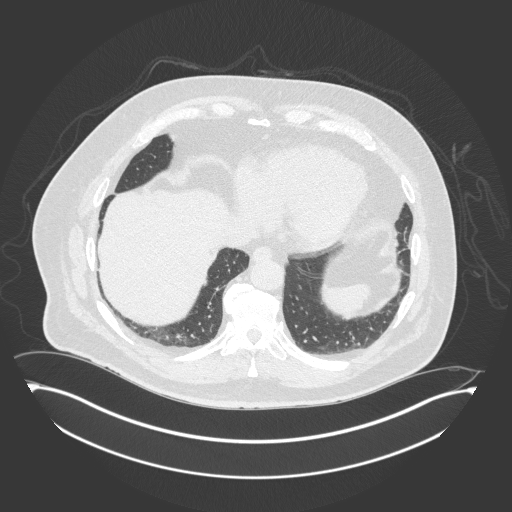

[Series 4: coronal pre · coronal · non-contrast · 0.96mm/px · 2 of 122 slices shown, 3 images]
[im 41/122  soft-tissue]
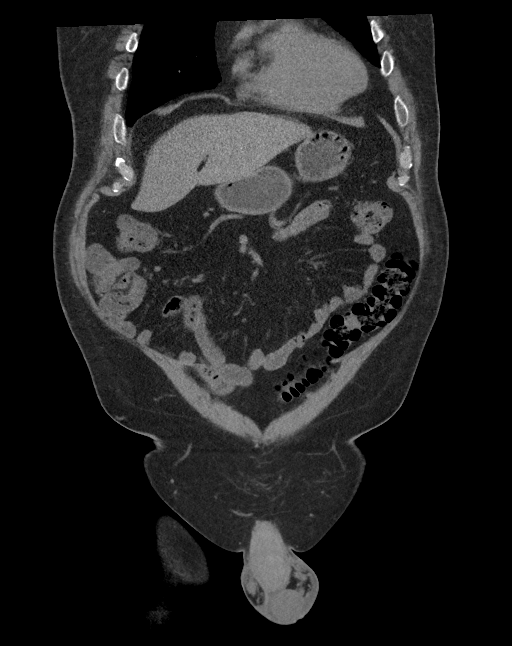
[im 41/122  bone]
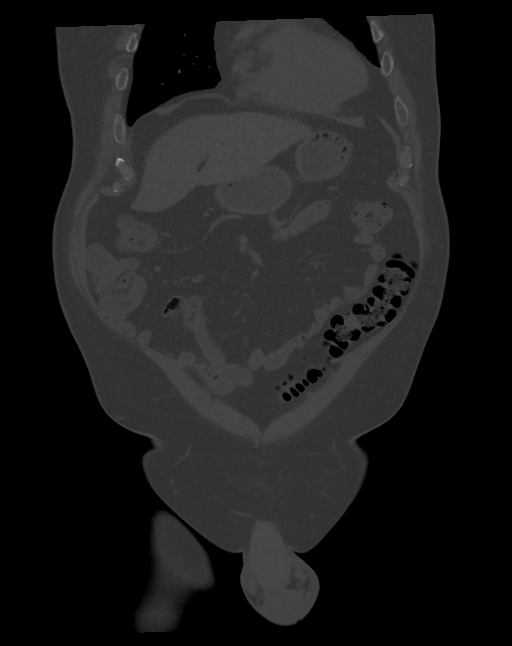
[im 81/122  soft-tissue]
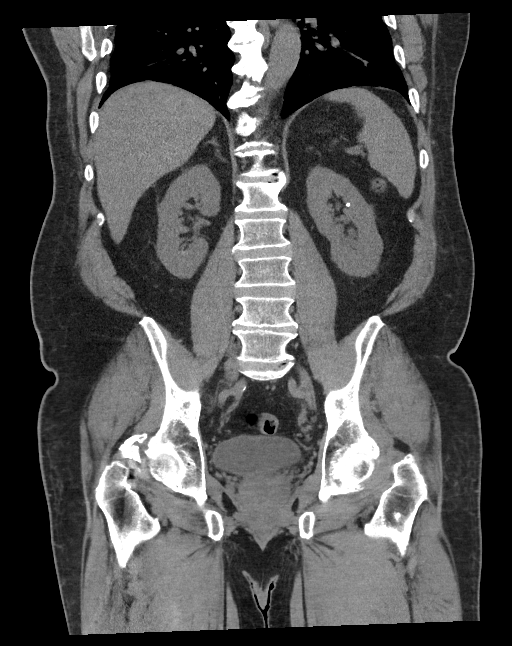

[Series 9: thins post · axial · 0.96mm/px · z∈[+830,+904]mm · 2 of 1174 slices shown]
[im 147/1174  soft-tissue]
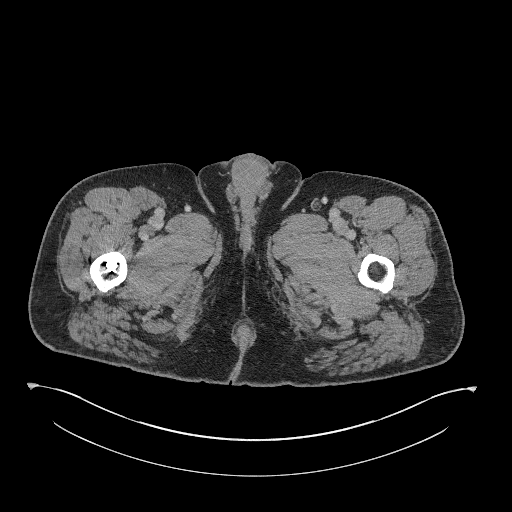
[im 294/1174  soft-tissue]
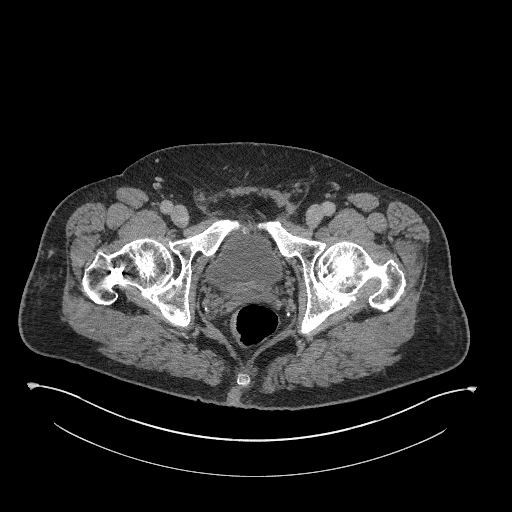

[11 of 46 positions shown; findings below may reference images not displayed]

RADIATION DOSE REDUCTION: This exam was performed according to the
departmental dose-optimization program which includes automated
exposure control, adjustment of the mA and/or kV according to
patient size and/or use of iterative reconstruction technique.

CONTRAST:  100mL OMNIPAQUE IOHEXOL 300 MG/ML  SOLN
FINDINGS: Lower Chest: 5 mm noncalcified pulmonary nodule seen in the lateral
left lower lobe on image [DATE].

Hepatobiliary: A tiny sub-cm low-attenuation lesion in the lateral
aspect of the right hepatic lobe is too small to characterize, but
most likely represents a tiny cyst. Gallbladder is unremarkable. No
evidence of biliary ductal dilatation.

Pancreas:  No mass or inflammatory changes.

Spleen: Within normal limits in size and appearance.

Adrenals/Urinary Tract: No adrenal masses identified. Two tiny less
than 5 mm left renal calculi are seen. No evidence of ureteral
calculi or hydronephrosis. No renal masses identified. No masses
seen involving the collecting systems, ureters, or bladder.

Stomach/Bowel: No evidence of obstruction, inflammatory process or
abnormal fluid collections. Normal appendix visualized.
Diverticulosis is seen mainly involving the sigmoid colon, however
there is no evidence of diverticulitis.

Vascular/Lymphatic: No pathologically enlarged lymph nodes. No acute
vascular findings. Aortic atherosclerotic calcification incidentally
noted.

Reproductive:  No mass or other significant abnormality.

Other:  None.

Musculoskeletal: No suspicious bone lesions identified.
Intramedullary rod noted in the right femur.
IMPRESSION: Tiny nonobstructing left renal calculi. No evidence of ureteral
calculi, hydronephrosis, or other acute findings.

No radiographic evidence of urinary tract neoplasm.

Colonic diverticulosis, without radiographic evidence of
diverticulitis.

5 mm indeterminate left lower lobe pulmonary nodule. No follow-up
needed if patient is low-risk. Non-contrast chest CT can be
considered in 12 months if patient is high-risk. This recommendation
follows the consensus statement: Guidelines for Management of
Incidental Pulmonary Nodules Detected on CT Images: From the

Aortic Atherosclerosis (IMFLW-8K6.6).

## 2023-07-03 ENCOUNTER — Other Ambulatory Visit: Payer: Self-pay

## 2023-07-03 ENCOUNTER — Encounter (HOSPITAL_COMMUNITY): Payer: Self-pay

## 2023-07-03 ENCOUNTER — Emergency Department (HOSPITAL_COMMUNITY)
Admission: EM | Admit: 2023-07-03 | Discharge: 2023-07-03 | Disposition: A | Discharge status: Home / Self Care | Attending: Emergency Medicine | Admitting: Emergency Medicine

## 2023-07-03 ENCOUNTER — Emergency Department (HOSPITAL_COMMUNITY)

## 2023-07-03 DIAGNOSIS — S8011XA Contusion of right lower leg, initial encounter: Secondary | ICD-10-CM | POA: Diagnosis not present

## 2023-07-03 DIAGNOSIS — W2209XA Striking against other stationary object, initial encounter: Secondary | ICD-10-CM | POA: Insufficient documentation

## 2023-07-03 DIAGNOSIS — S8991XA Unspecified injury of right lower leg, initial encounter: Secondary | ICD-10-CM | POA: Diagnosis present

## 2023-07-03 NOTE — ED Provider Notes (Signed)
 [place] EMERGENCY DEPARTMENT AT [facility] Provider Note   CSN: [id number] Arrival date & time: 07/03/23  1543     History  Chief Complaint  Patient presents with   Knee Pain    Hawke Villalpando Gallardo is a 73 y.o. male.   Knee Pain Associated symptoms: no back pain and no fever         KIT MOLLETT is a 73 y.o. male who presents to the Emergency Department complaining of localized swelling just distal to his right knee.  He states 3 days ago he accidentally struck his knee on a desk.  He denies any pain of the area or pain to his knee joint.  No numbness or tingling of his extremity.  He states he has never noticed the swollen area below his knee before.   Home Medications Prior to Admission medications   Medication Sig Start Date End Date Taking? Authorizing Provider  Acetaminophen 500 MG capsule Take 500 mg by mouth every 6 (six) hours as needed for moderate pain. 03/31/21   [provider]  cetirizine (ZYRTEC) 10 MG tablet Take 10 mg by mouth at bedtime.    [provider]  docusate sodium (COLACE) 100 MG capsule Take 1 capsule (100 mg total) by mouth 2 (two) times daily. 12/26/21   Harrie Foreman, PA-C  EPINEPHrine 0.3 mg/0.3 mL IJ SOAJ injection Inject 0.3 mg into the muscle as needed for anaphylaxis.    [provider]  fluticasone (FLONASE) 50 MCG/ACT nasal spray Place 1 spray into both nostrils daily as needed for allergies.    [provider]  psyllium (HYDROCIL/METAMUCIL) 95 % PACK Take 1 packet by mouth daily.    [provider]  sulfamethoxazole-trimethoprim (BACTRIM DS) 800-160 MG tablet Take 1 tablet by mouth 2 (two) times daily. Start the day prior to foley removal appointment 12/26/21   Harrie Foreman, PA-C  traMADol (ULTRAM) 50 MG tablet Take 1-2 tablets (50-100 mg total) by mouth every 6 (six) hours as needed for moderate pain or severe pain. 12/26/21   Harrie Foreman, PA-C      Allergies    Lortab  [hydrocodone-acetaminophen], Robaxin [methocarbamol], Doxycycline, Other, and Statins    Review of Systems   Review of Systems  Constitutional:  Negative for chills and fever.  Respiratory:  Negative for shortness of breath.   Cardiovascular:  Negative for chest pain.  Gastrointestinal:  Negative for abdominal pain.  Musculoskeletal:  Negative for arthralgias, back pain and joint swelling.       Localized area of swelling distal to the right knee  Skin:  Negative for color change and wound.  Neurological:  Negative for weakness and numbness.    Physical Exam Updated Vital Signs BP (!) 142/71 (BP Location: Right Arm)   Pulse 65   Temp 98.3 F (36.8 C) (Oral)   Resp 18   Ht 5\' 9"  (1.753 m)   Wt 89.4 kg   SpO2 95%   BMI 29.09 kg/m  Physical Exam Vitals and nursing note reviewed.  Constitutional:      General: He is not in acute distress.    Appearance: Normal appearance. He is not ill-appearing or toxic-appearing.  Cardiovascular:     Rate and Rhythm: Normal rate and regular rhythm.     Pulses: Normal pulses.  Pulmonary:     Effort: Pulmonary effort is normal.  Musculoskeletal:        General: No tenderness. Normal range of motion.     Right  knee: Normal range of motion. No tenderness. No LCL laxity, MCL laxity or ACL laxity. Normal patellar mobility.     Instability Tests: Anterior drawer test negative. Posterior drawer test negative.       Legs:     Comments: 3 to 4 cm area of localized circumferential area of swelling just distal to the right knee joint.  3 very small abrasions to the area.  No tenderness, open wound or ecchymosis of the area.  No tenderness on valgus or varus stress of the right knee.  Patellar tendon nontender.  No bony deformity noted.  Skin:    General: Skin is warm.     Capillary Refill: Capillary refill takes less than 2 seconds.  Neurological:     Mental Status: He is alert.     ED Results / Procedures / Treatments   Labs (all labs ordered  are listed, but only abnormal results are displayed) Labs Reviewed - No data to display  EKG None  Radiology DG Tibia/Fibula Right Result Date: 07/03/2023 CLINICAL DATA:  Hit knee pain EXAM: RIGHT TIBIA AND FIBULA - 2 VIEW COMPARISON:  Femur radiograph 12/18/2022, knee radiograph 12/18/2022 FINDINGS: No definitive fracture or malalignment. Partially visualized intramedullary rod in the distal femur. Moderate patellofemoral degenerative change. Positive for knee effusion. Stable linear density in the infrapatellar soft tissues. IMPRESSION: 1. No definitive acute osseous abnormality. 2. Knee effusion. 3. Moderate patellofemoral degenerative change. Electronically Signed   By: Jasmine Pang M.D.   On: 07/03/2023 17:08    Procedures Procedures    Medications Ordered in ED Medications - No data to display  ED Course/ Medical Decision Making/ A&P                                 Medical Decision Making Patient here for evaluation after direct blow to right lower leg.  Area of concern does not involve the knee joint.  States he struck his knee on a desk 3 days ago.  No pain to the area or difficulty walking or standing.  No paresthesias noted.  There is 3 any abrasions noted to the area that are nontender to touch.  Circumferential, mobile mass noted to the medial aspect of superior lower leg.   I suspect area of concern is a lipoma.  Contusion, hematoma also considered.  Low clinical suspicion for fracture.  No involvement of the knee joint itself.  Amount and/or Complexity of Data Reviewed Radiology: ordered.    Details: X-ray of the right tib-fib without acute bony finding.  Knee effusion noted without knee pain on exam Discussion of management or test interpretation with external provider(s):  Suspect contusion versus lipoma.  X-ray reassuring.  Patient reassured, agreeable to symptomatic treatment will follow-up with orthopedics as needed.           Final Clinical Impression(s)  / ED Diagnoses Final diagnoses:  Contusion of right lower leg, initial encounter    Rx / DC Orders ED Discharge Orders     None         Pauline Aus, PA-C 07/03/23 1726    Cathren Laine, MD 07/03/23 Barry Brunner

## 2023-07-03 NOTE — ED Triage Notes (Signed)
 Pt stated that he hit his knee on Wed night and had pain for a few minutes and then was better, but notices a large knot below his knee. Pt unable to tell if they injuries are related. Pt has no pain at this time

## 2023-07-03 NOTE — Discharge Instructions (Signed)
 You may apply ice packs on and off to help with swelling.  Tylenol if needed for pain.  Follow-up with your orthopedic provider in 1 week if needed.

## 2023-12-13 ENCOUNTER — Telehealth: Payer: Self-pay | Admitting: Orthopedic Surgery

## 2023-12-13 NOTE — Telephone Encounter (Signed)
 Tried to return the pt's call, no answer, no voicemail
